# Patient Record
Sex: Female | Born: 1975 | Race: White | Hispanic: No | Marital: Married
Health system: Midwestern US, Academic
[De-identification: ages and names within clinical notes are randomized; demographics above are authoritative.]

---

## 2020-01-02 IMAGING — CR PELVIS
5 series · 5 of 5 positions shown · non-contrast
Comparison: none

[l-spine ap]
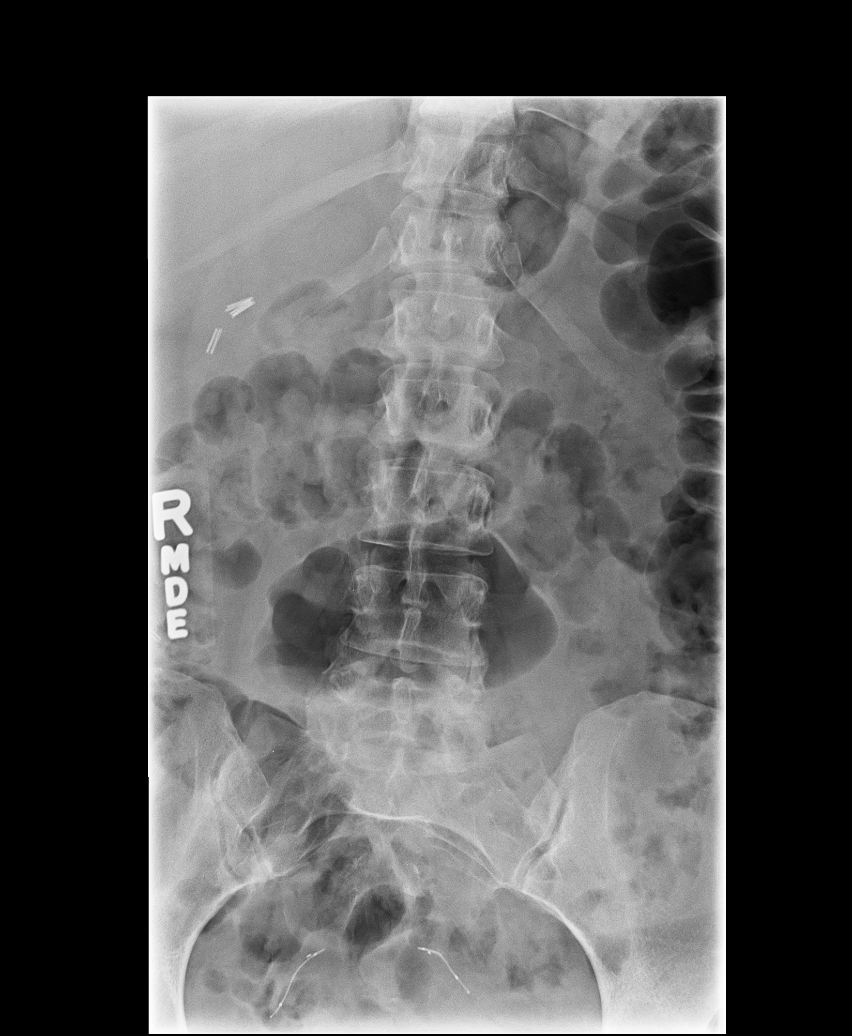

[l-spine obl (1 of 2)]
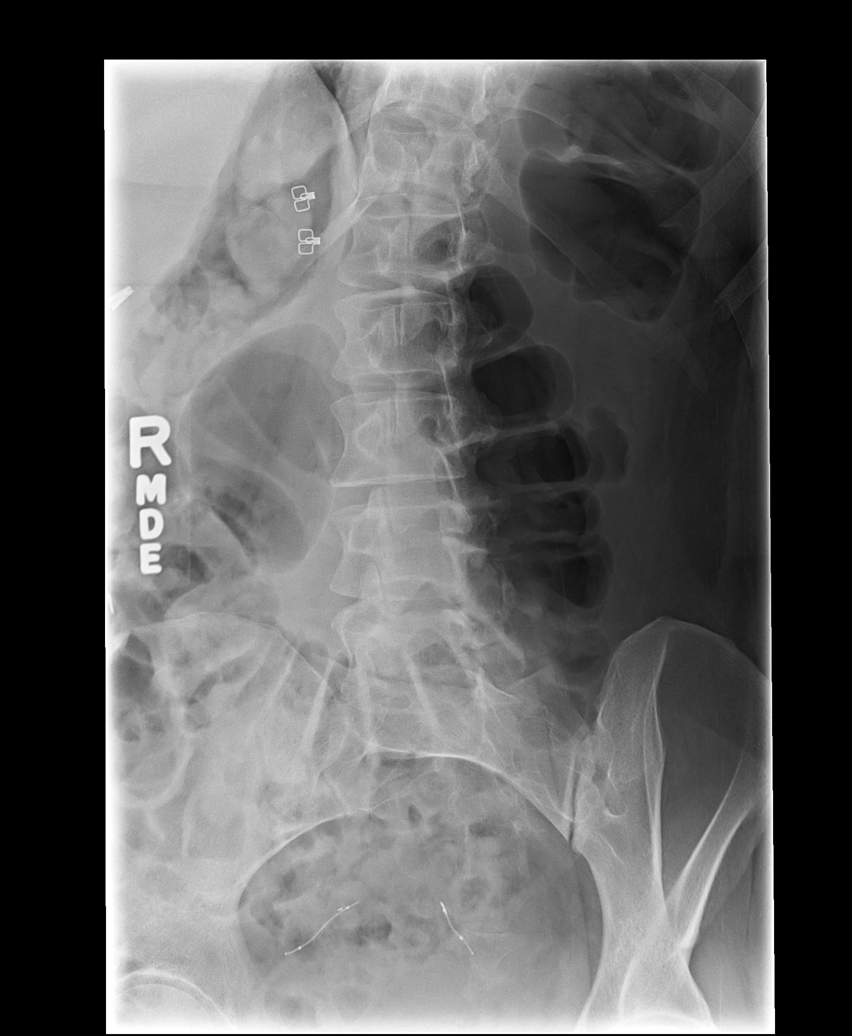

[l-spine obl (2 of 2)]
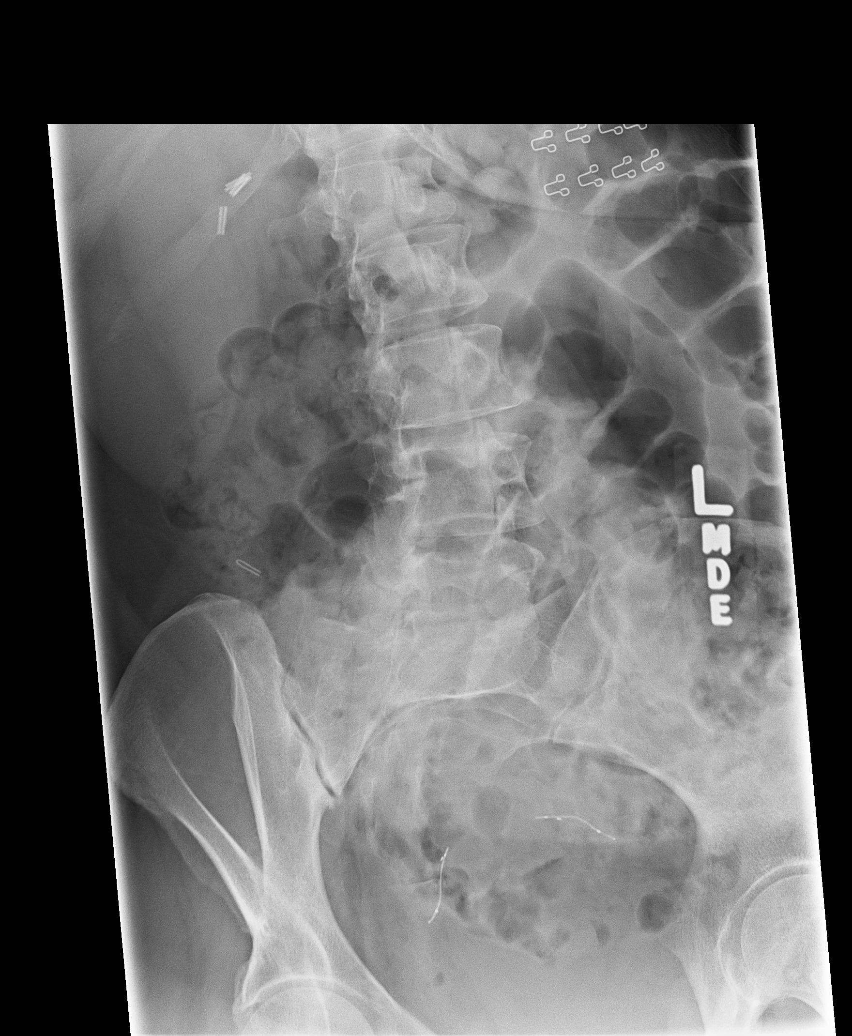

[l-spine lat]
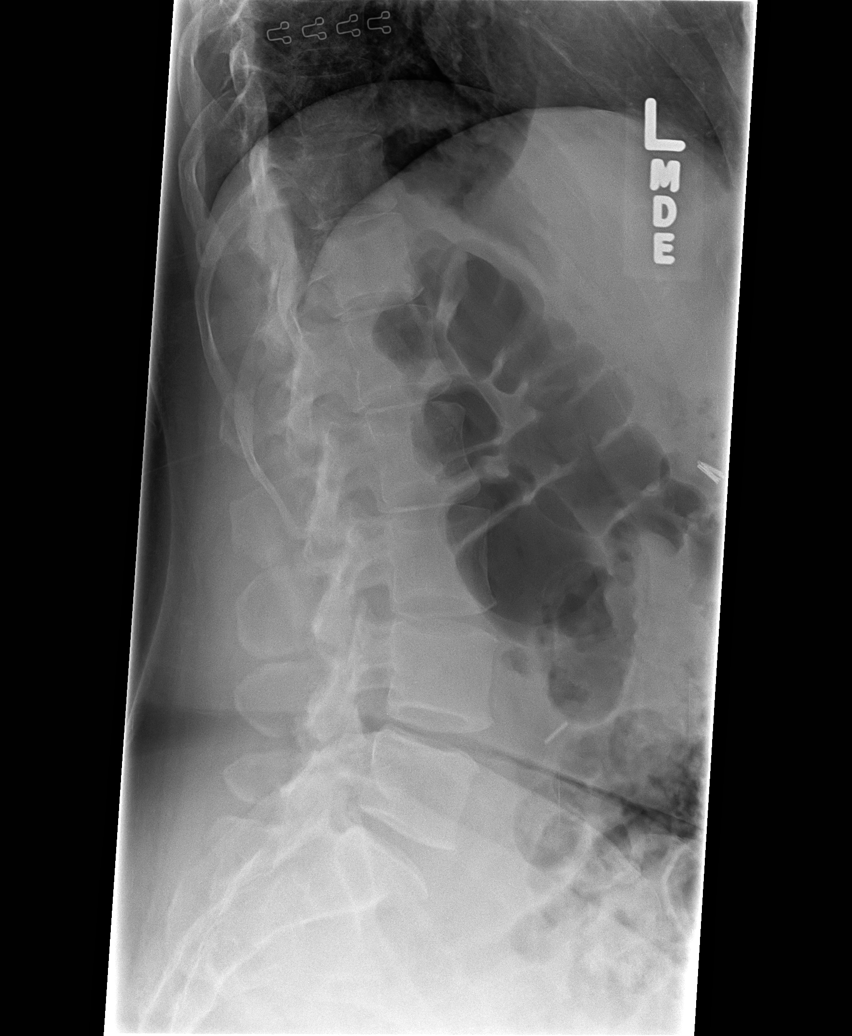

[l-spine l5-s1]
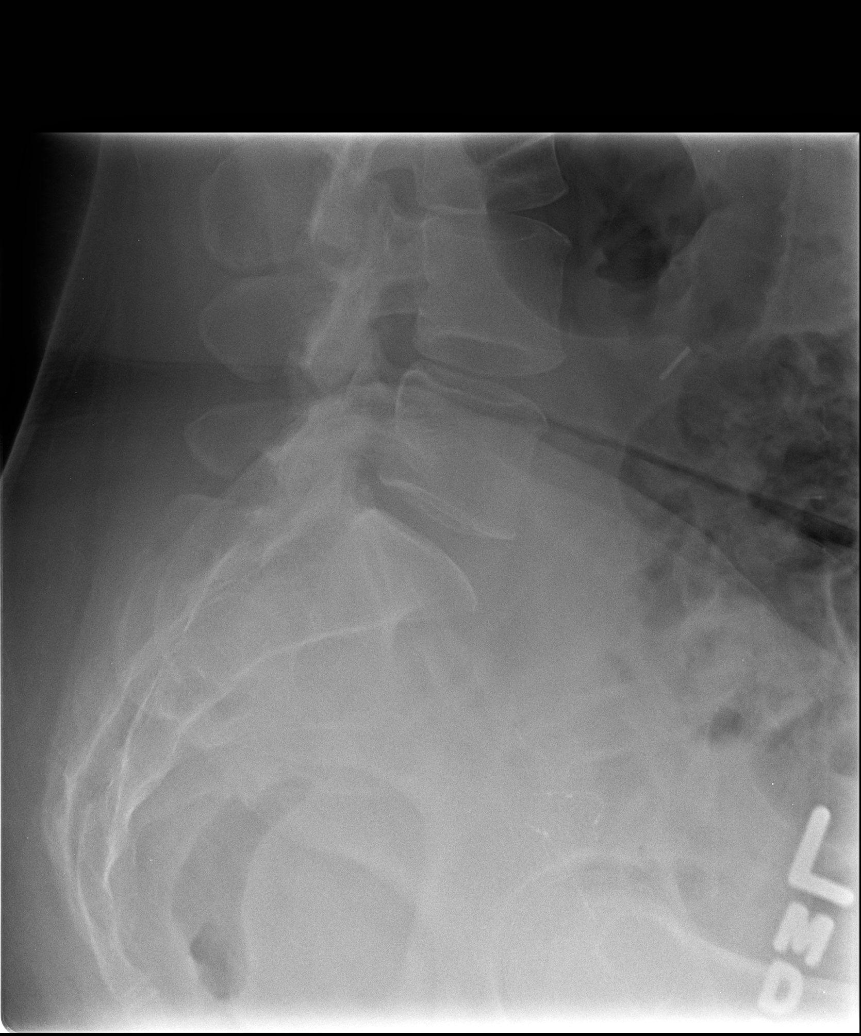

[5 of 5 positions shown; findings below may reference images not displayed]

DIAGNOSTIC STUDIES

EXAM

XR lumbar spine 5 view

INDICATION

pain
Pt c/o chronic lower back pain radiating down legs. ME/CF

TECHNIQUE

AP lateral both oblique and spot films were obtained.

COMPARISONS

None available

FINDINGS

Surgical clips are noted the right upper quadrant. Prior flow been tube embolization is noted. No
compression fractures are seen. Disc spaces are well preserved.

IMPRESSION

Negative lumbar spine. Postop changes as described.

Tech Notes:

Pt c/o chronic lower back pain radiating down legs. ME/CF

## 2020-03-21 IMAGING — CR [ID]
1 series · 1 of 1 positions shown · non-contrast
Comparison: none

[abdomen kub supine]
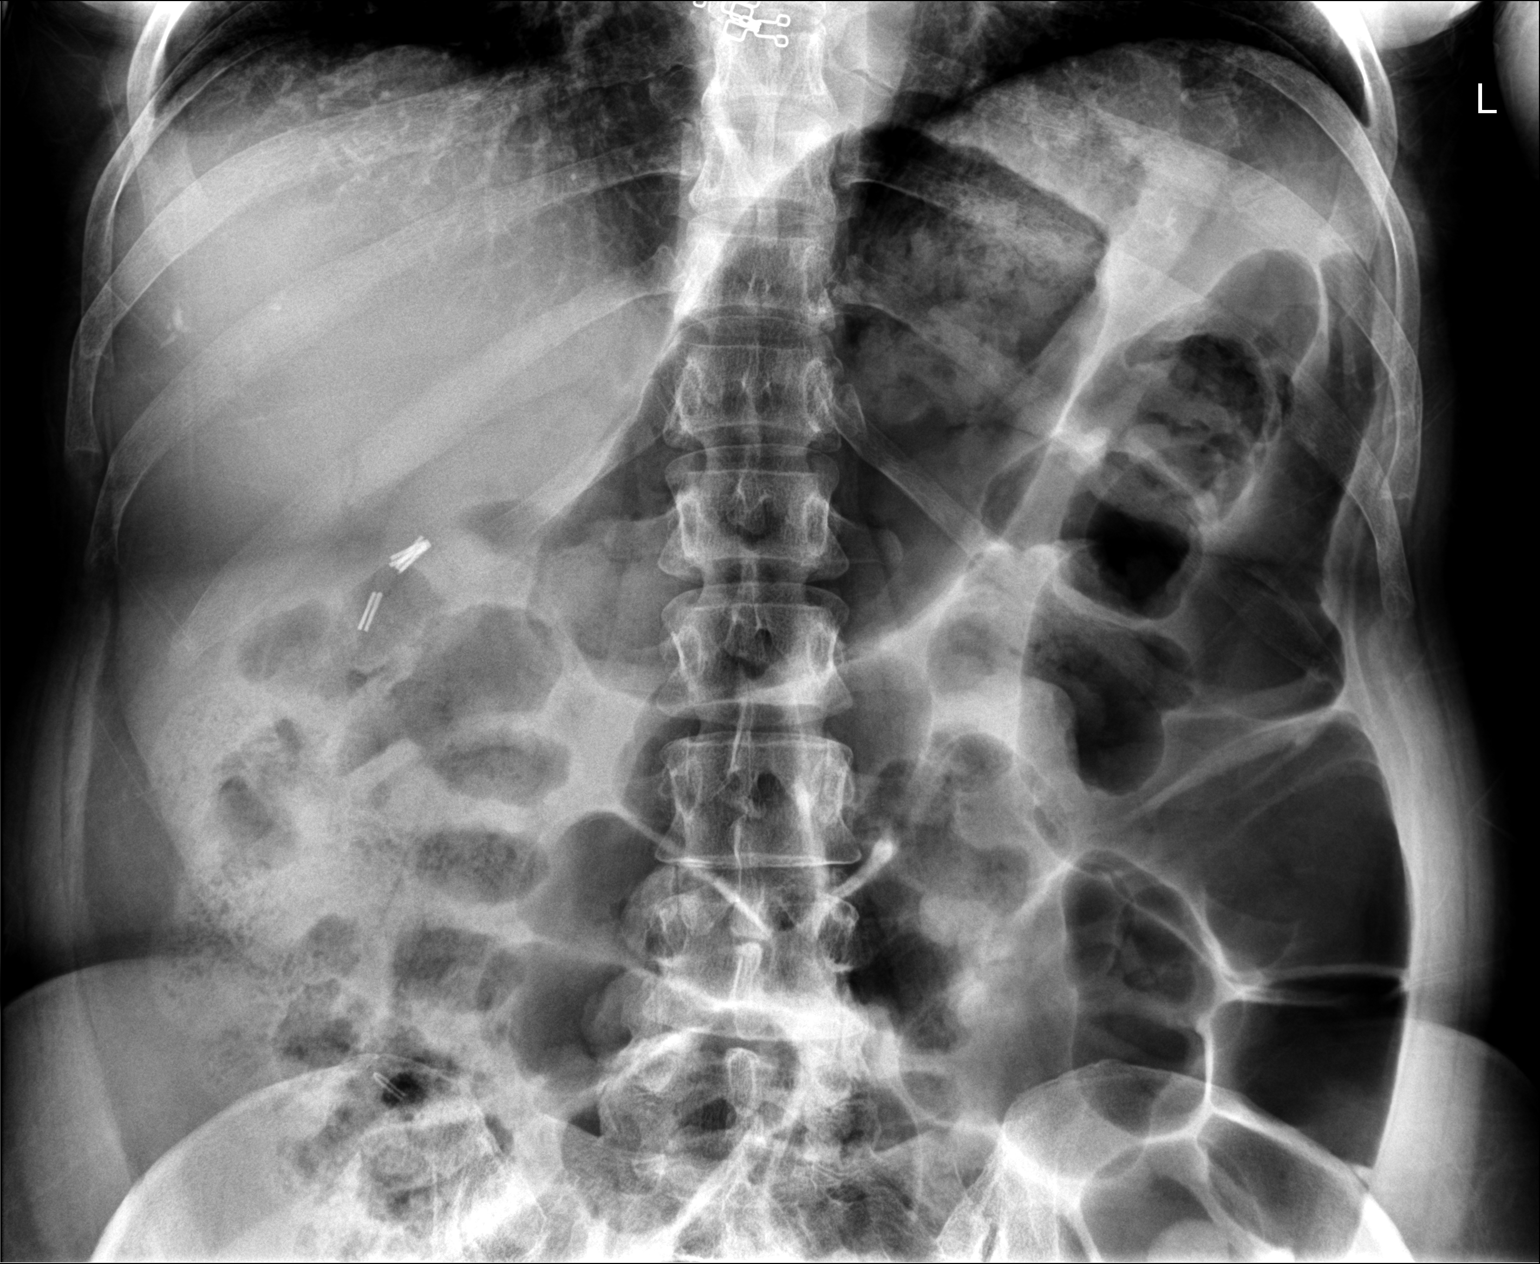

[1 of 1 positions shown; findings below may reference images not displayed]

XR abd 2v, upright and supine Sex:

DIAGNOSTIC STUDIES

EXAM

XR abd 2v, upright and supine

INDICATION

Constipation
Pt states she has not had a bowel movement in 2 weeks. H/O cholecystectomy and c section. ME

TECHNIQUE

COMPARISONS

None

FINDINGS

No free airNonobstructive bowel gas pattern. Gaseous distention of the distal large bowel. Moderate
colonic stool
no pathologic calcifications
Surgical clips in the right upper quadrant. Fallopian tube closure devices

Visualized lung bases are clear

Visualized osseous structures unremarkable

IMPRESSION

Nonobstructive bowel gas pattern

Tech Notes:

Pt states she has not had a bowel movement in 2 weeks. H/O cholecystectomy and c section. ME

## 2020-05-02 IMAGING — CR [ID]
1 series · 1 of 1 positions shown · non-contrast
Comparison: none

[chest ap]
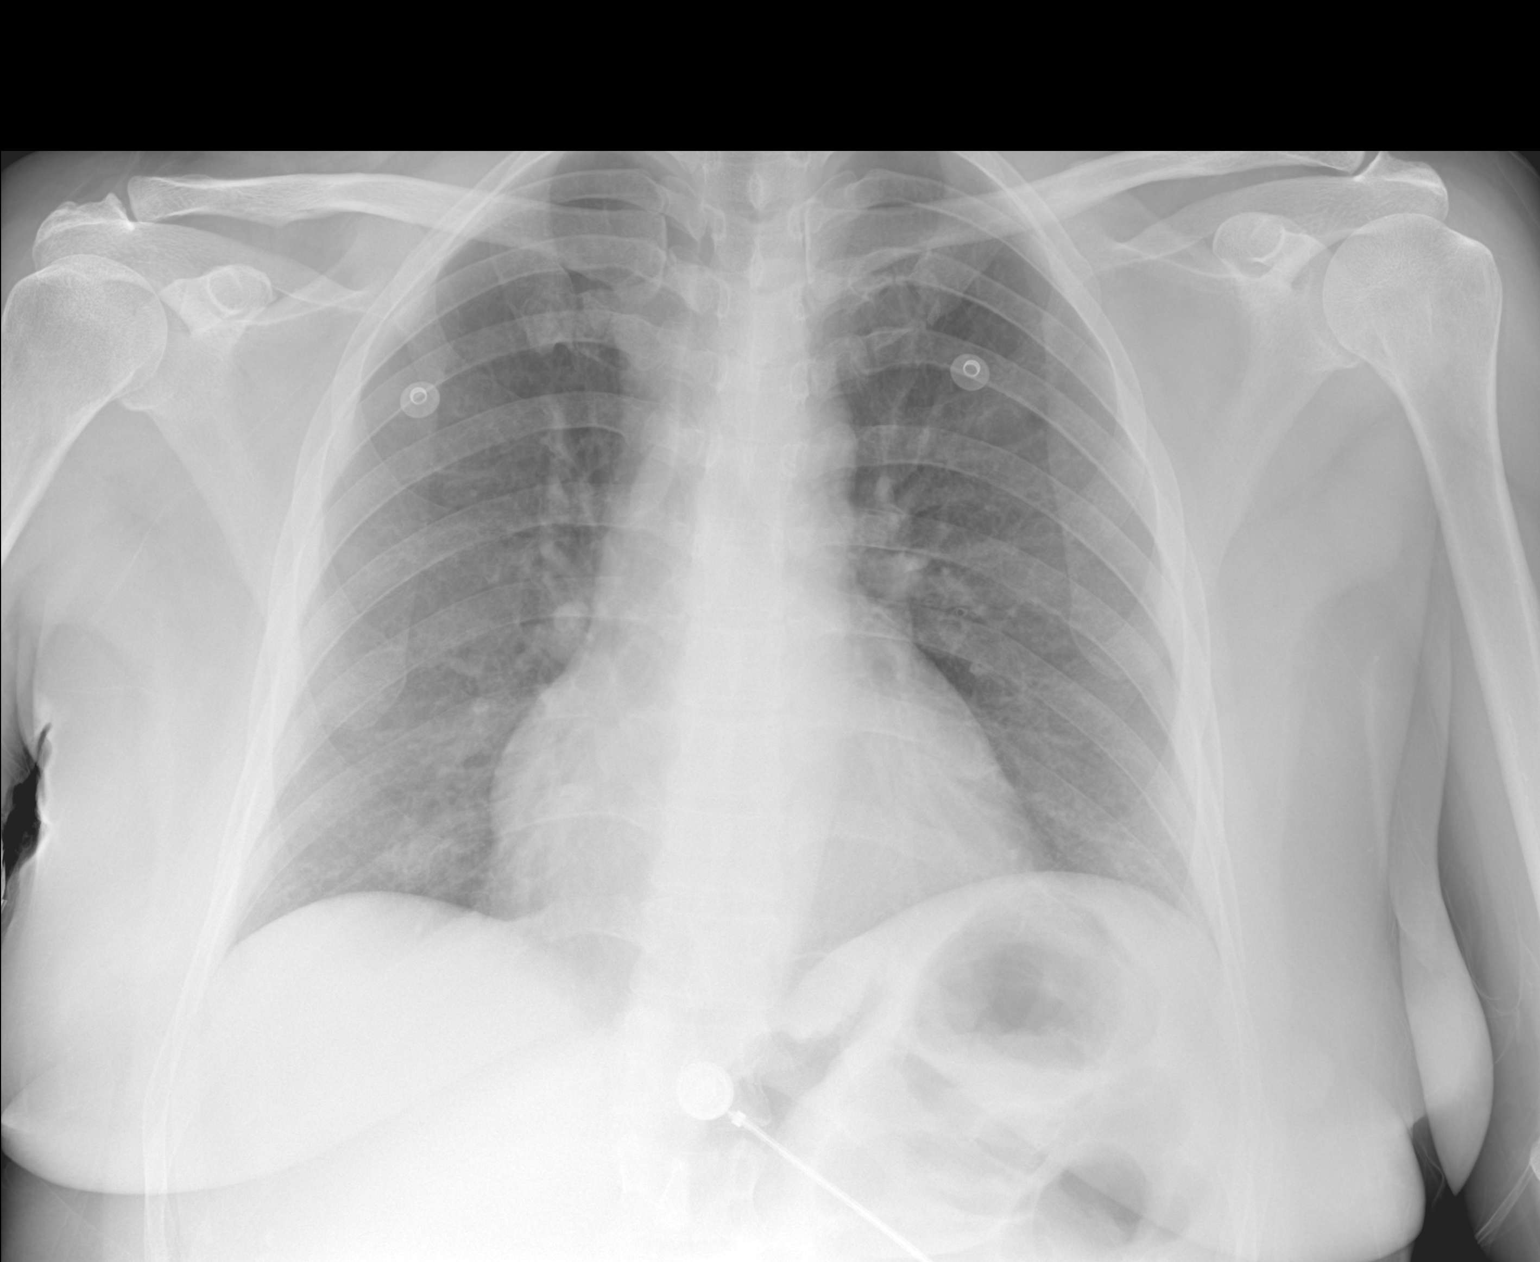

[1 of 1 positions shown; findings below may reference images not displayed]

EXAM

XR chest 1V

INDICATION

Chest pain

TECHNIQUE

Single view of the chest

COMPARISONS

None available at the time of dictation.

FINDINGS

No radiographically apparent pleural effusion, consolidation, or pneumothorax.

The cardiomediastinal silhouette is  normal in size.

The osseous structures are without an acute osseous abnormality.

IMPRESSION
1. No radiographic evidence of an acute cardiopulmonary process.

Tech Notes:

PT C/O CHEST PAIN.  AB

## 2020-05-02 IMAGING — CT BRAIN WO(Adult)
3 of 4 series · 14 of 47 positions shown, 16 images · non-contrast
Comparison: none

[Series 4: brain cor 5.00 hr40 s3 · coronal · 0.32mm/px · 3 of 42 slices shown]
[im 14/42  brain]
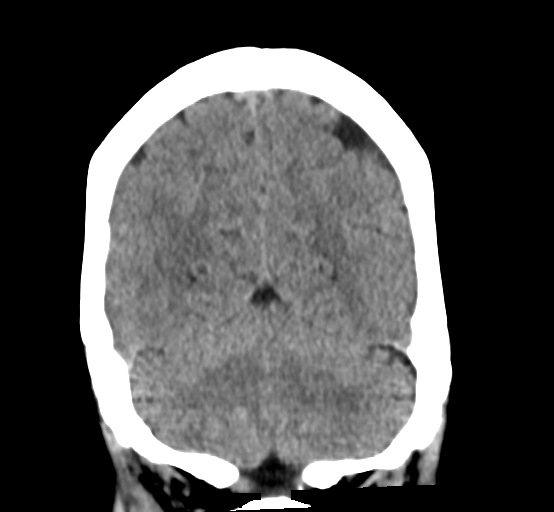
[im 19/42  brain]
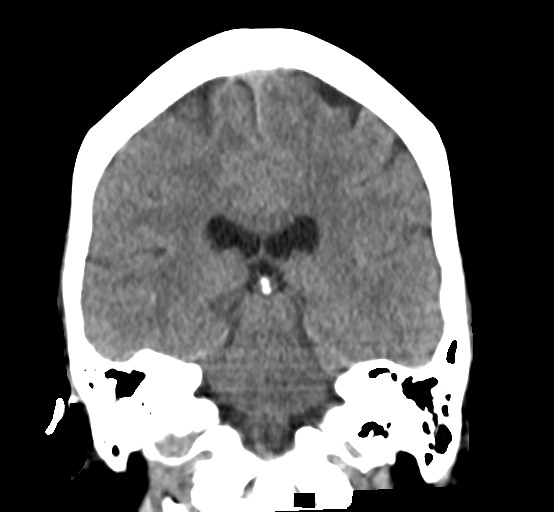
[im 23/42  brain]
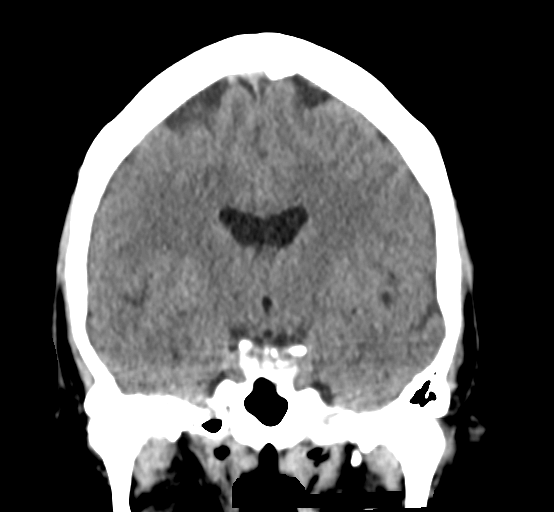

[Series 6: brain sag 5.00 hr40 s3 · sagittal · 0.32mm/px · 3 of 35 slices shown]
[im 12/35  brain]
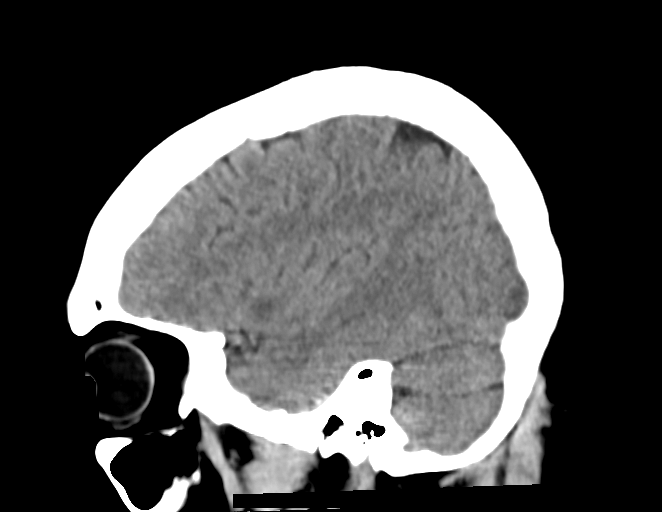
[im 18/35  brain]
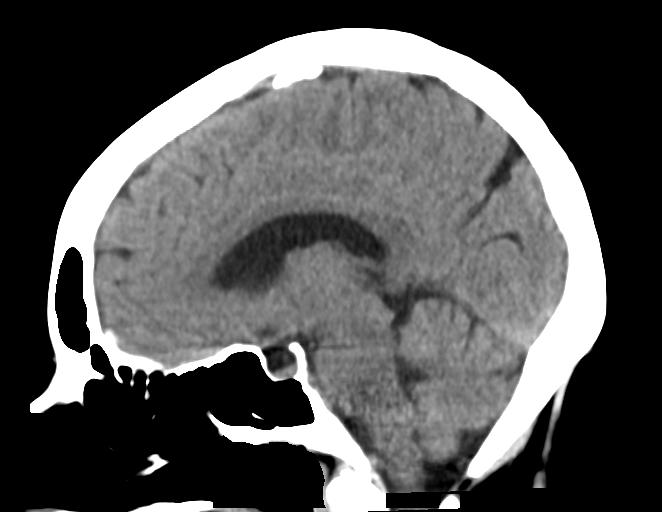
[im 23/35  brain]
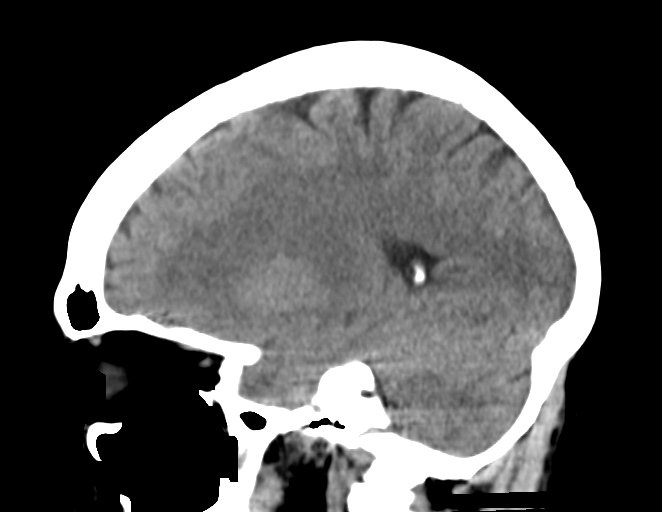

[Series 8: brain ax 2.00 hr60 s3 · axial · 0.34mm/px · z∈[-479,-349]mm · 8 of 81 slices shown, 10 images]
[im 8/81  brain]
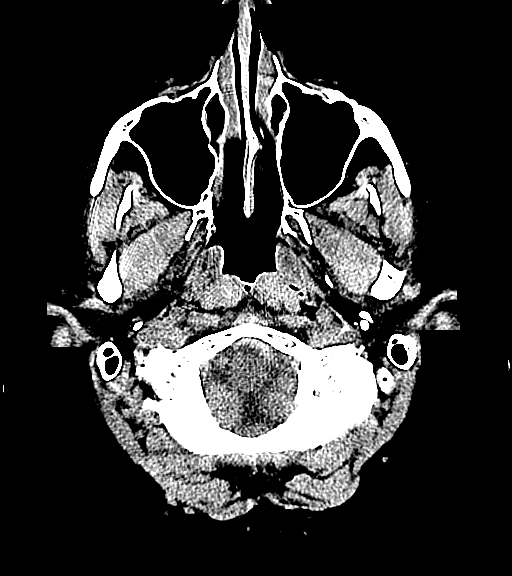
[im 8/81  bone]
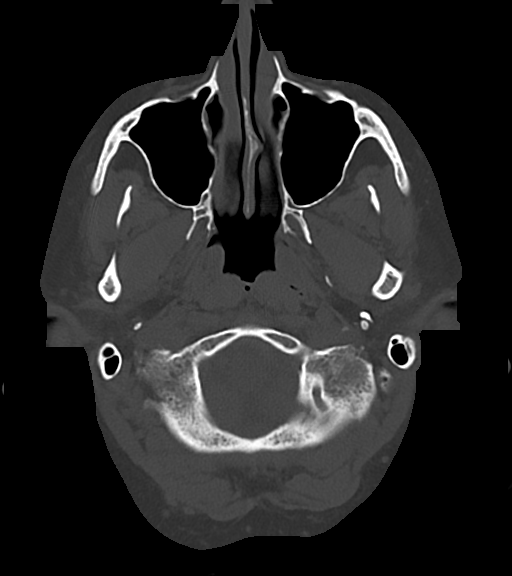
[im 16/81  brain]
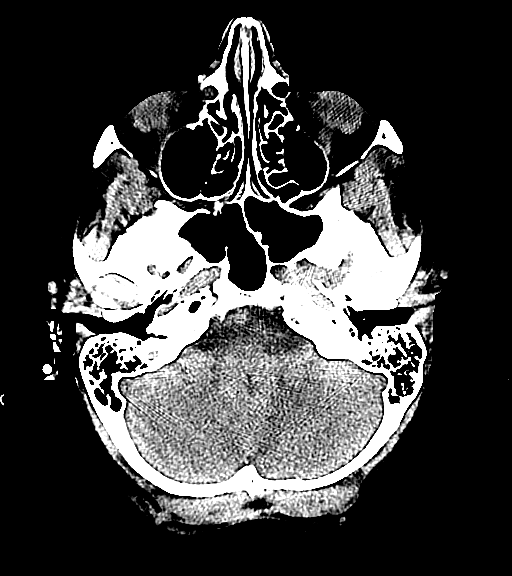
[im 27/81  brain]
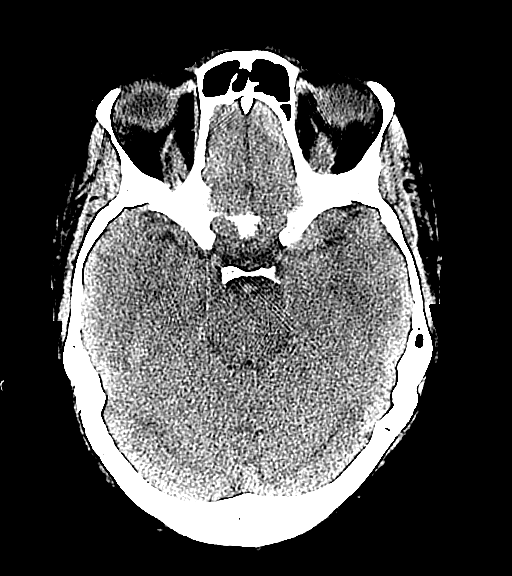
[im 35/81  brain]
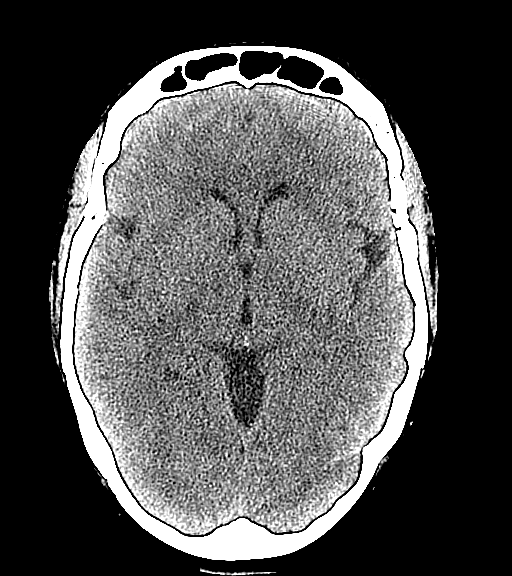
[im 46/81  brain]
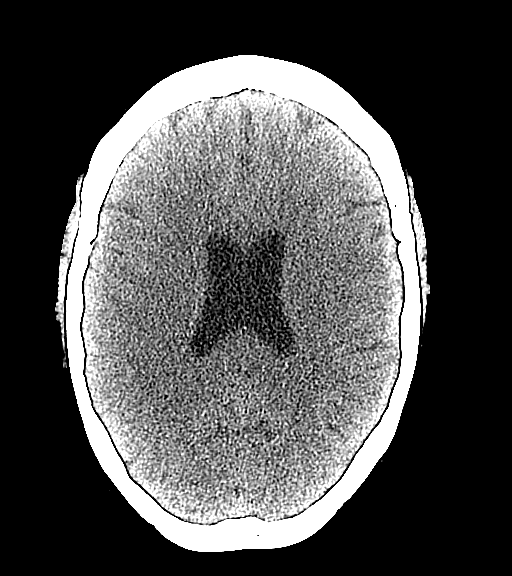
[im 46/81  bone]
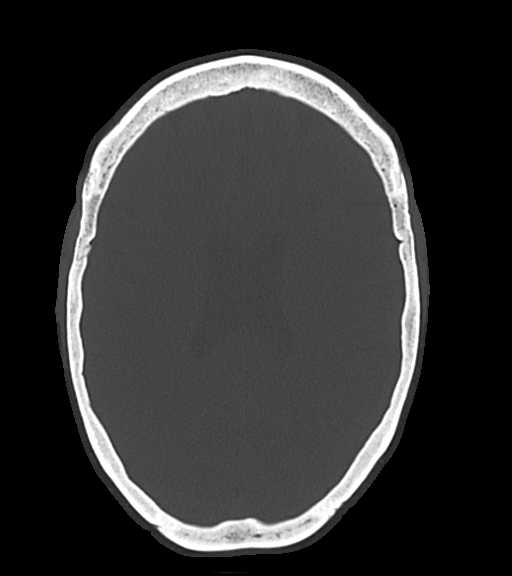
[im 54/81  brain]
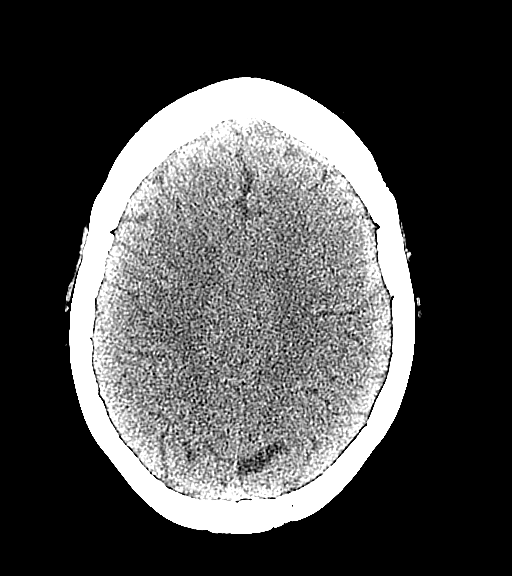
[im 65/81  brain]
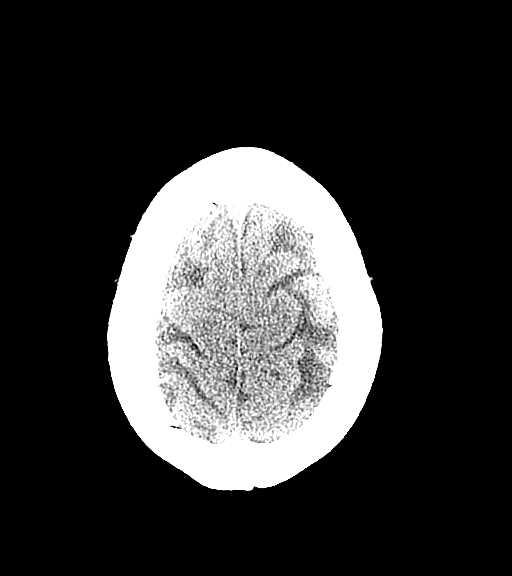
[im 73/81  brain]
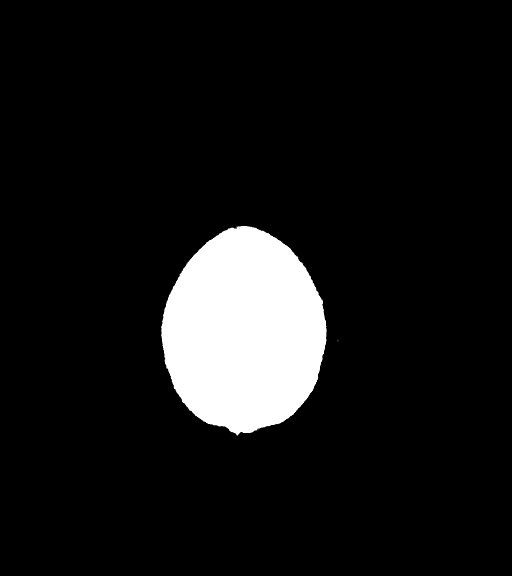

[14 of 47 positions shown; findings below may reference images not displayed]

EXAM

Head CT.

INDICATION

left arm numb
PT STATES HAS HEADACHE AND LEFT ARM NUMBNESS. CT/NM 2/0. TJ

TECHNIQUE

Noncontrast CT scan of the head with coronal and sagittal reformations. 2 prior CT scans at no
myocardial profusion scans in the past year. All CT scans at this facility use dose modulation,
iterative reconstruction, and/or weight based dosing when appropriate to reduce radiation dose to as
low as reasonably achievable.

COMPARISONS

December 09, 2019 head CT

FINDINGS

There is no intra-axial or extra-axial hemorrhage. The lateral ventricles and other CSF spaces are
symmetric in size and age appropriate. No midline shift. No mass effect. The paranasal sinuses and
mastoid air cells are clear. The orbits are symmetric. No postseptal or retrobulbar edema or
hemorrhage. The midline anatomy is unremarkable. Gray-white differentiation is preserved.

IMPRESSION

No evidence of acute intracranial pathology.

Tech Notes:

PT STATES HAS HEADACHE AND LEFT ARM NUMBNESS. CT/NM 2/0. TJ

## 2020-05-06 IMAGING — CT PE(Adult)
2 of 4 series · 15 of 36 positions shown · non-contrast
Comparison: none

[Series 7: cta pulmonary cor 2.00 bv36 s3 · coronal · 0.62mm/px · 3 of 141 slices shown]
[im 29/141  mediastinal]
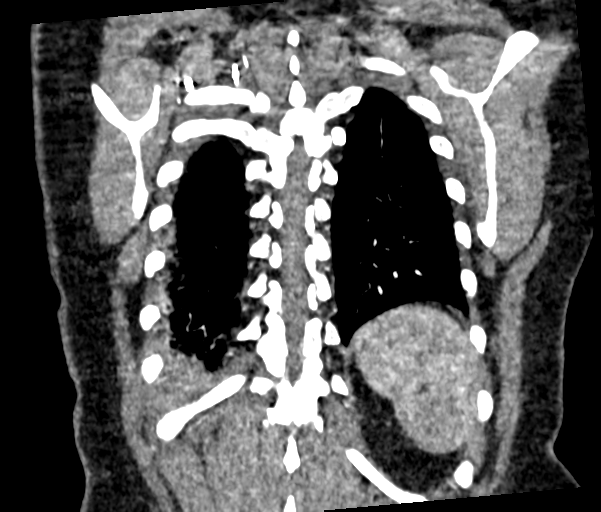
[im 57/141  mediastinal]
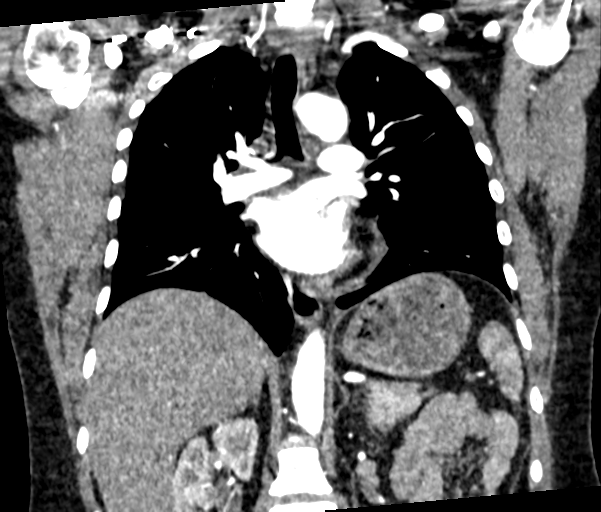
[im 85/141  mediastinal]
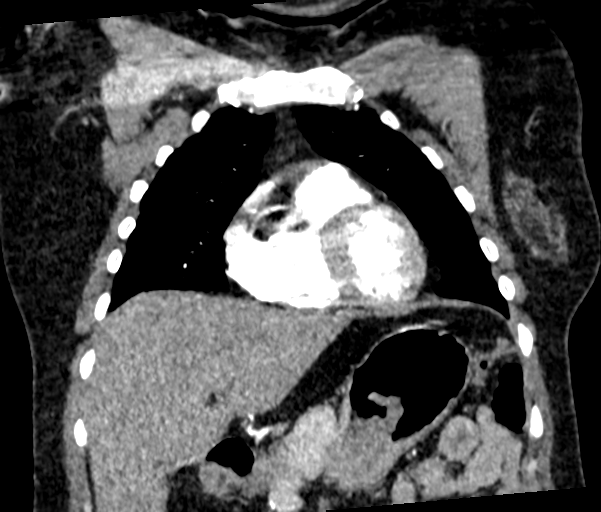

[Series 11: cta pulmonary ax 1.50 br60 s3 · axial · 0.55mm/px · z∈[+1508,+1778]mm · 12 of 209 slices shown]
[im 14/209  lung]
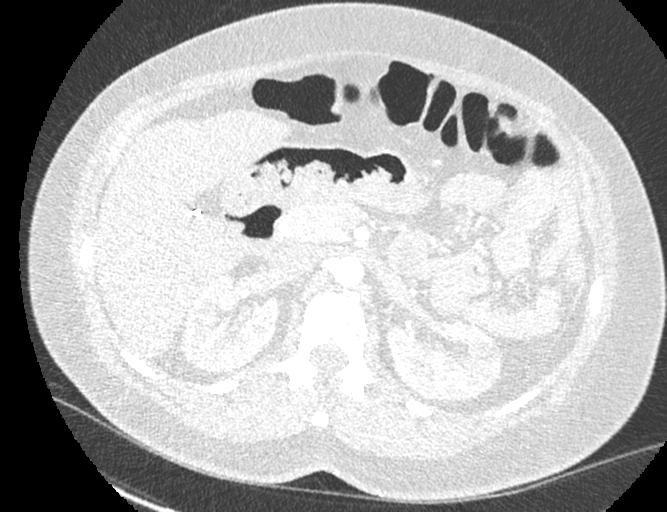
[im 28/209  mediastinal]
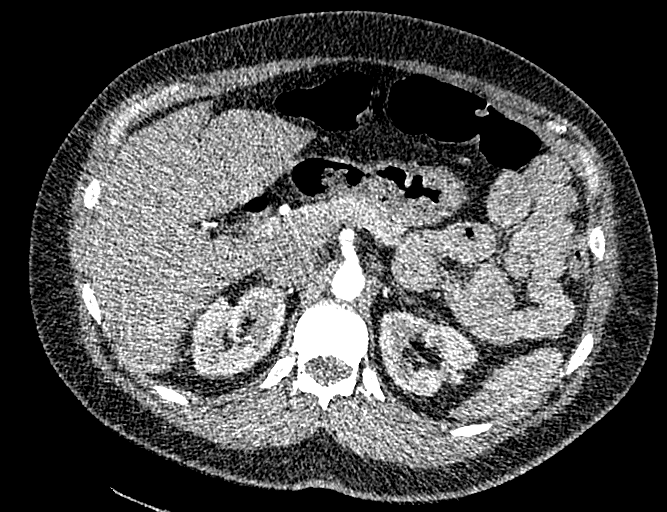
[im 42/209  lung]
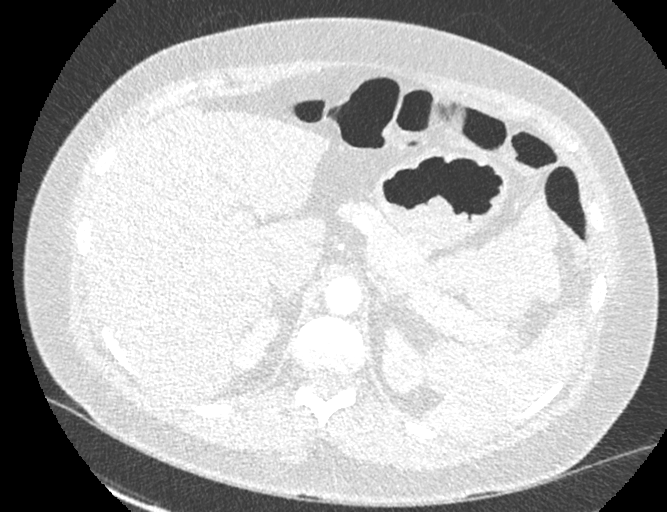
[im 70/209  mediastinal]
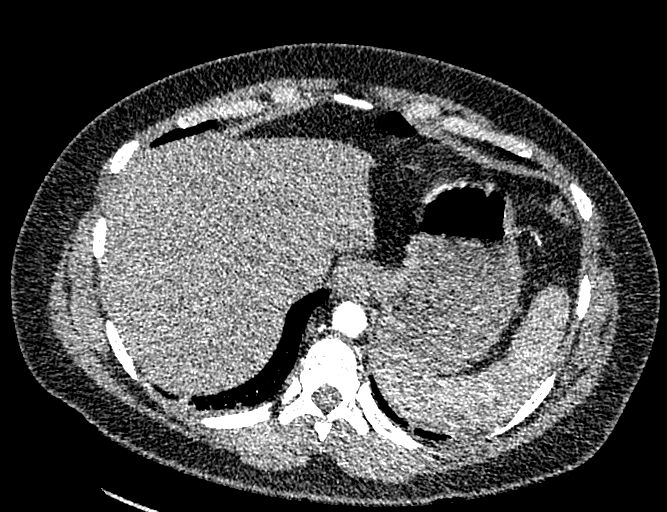
[im 84/209  lung]
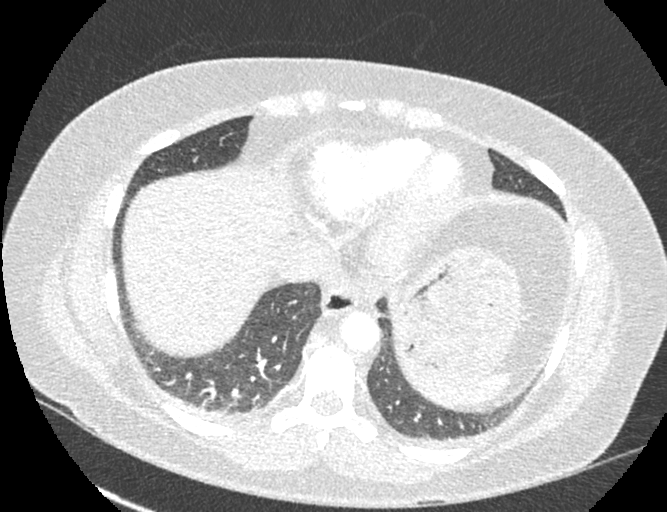
[im 98/209  mediastinal]
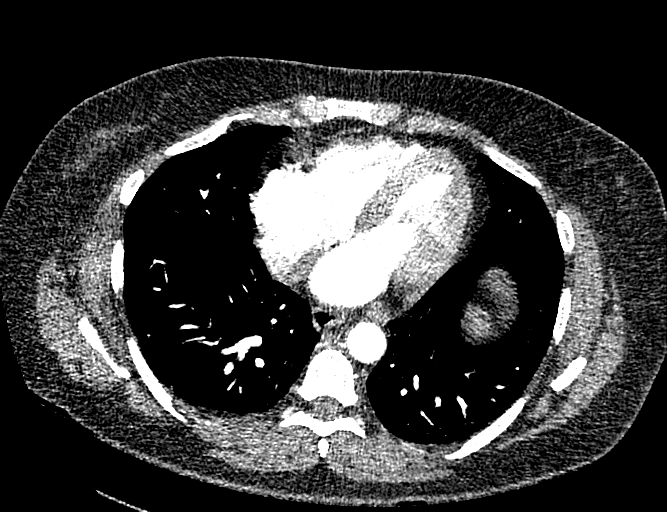
[im 111/209  lung]
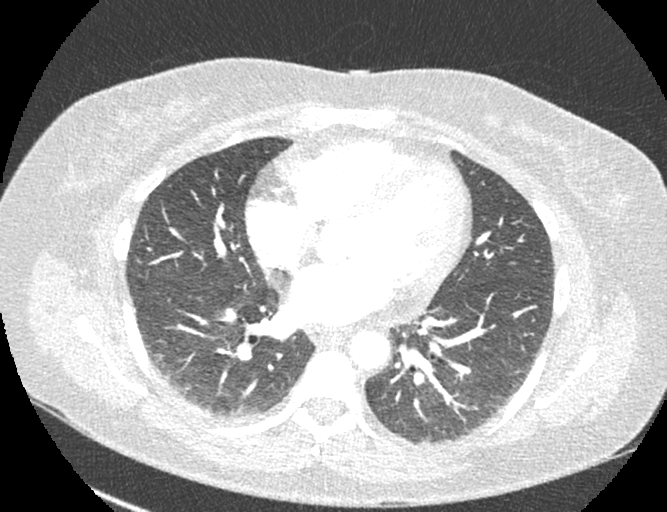
[im 125/209  mediastinal]
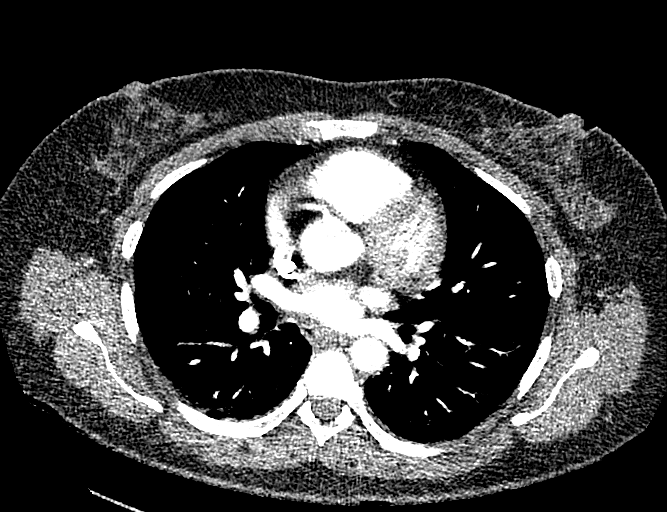
[im 139/209  lung]
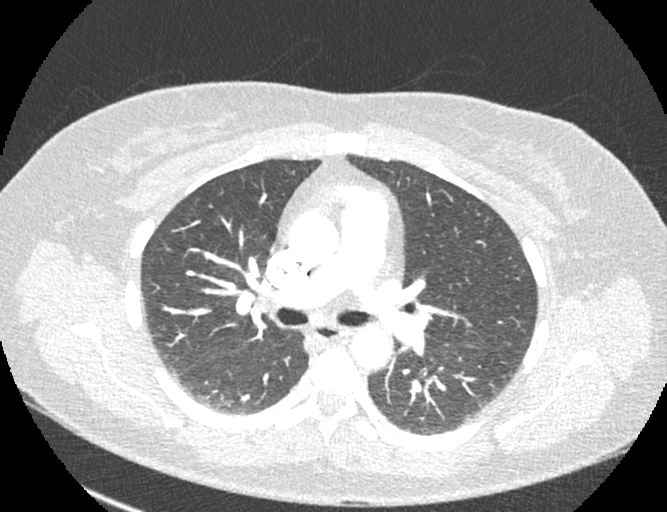
[im 167/209  mediastinal]
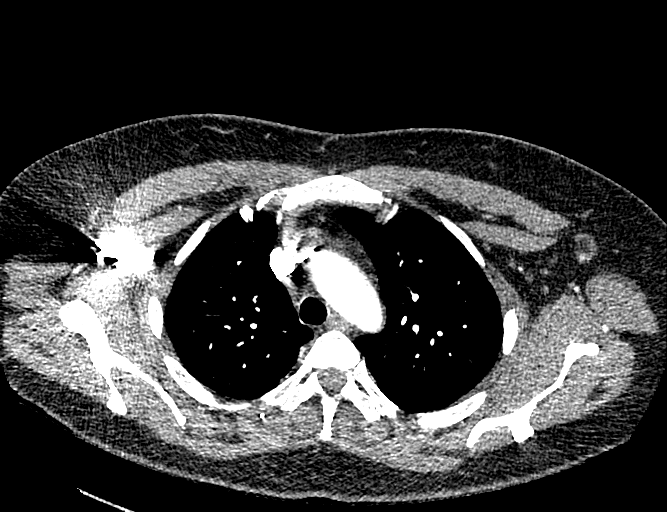
[im 181/209  lung]
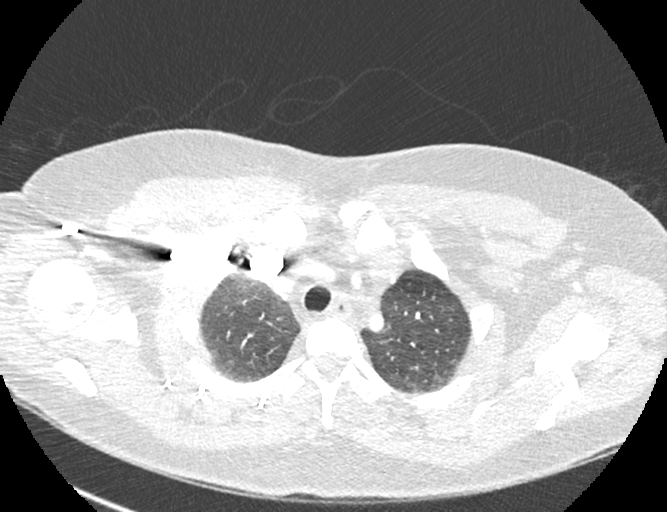
[im 195/209  mediastinal]
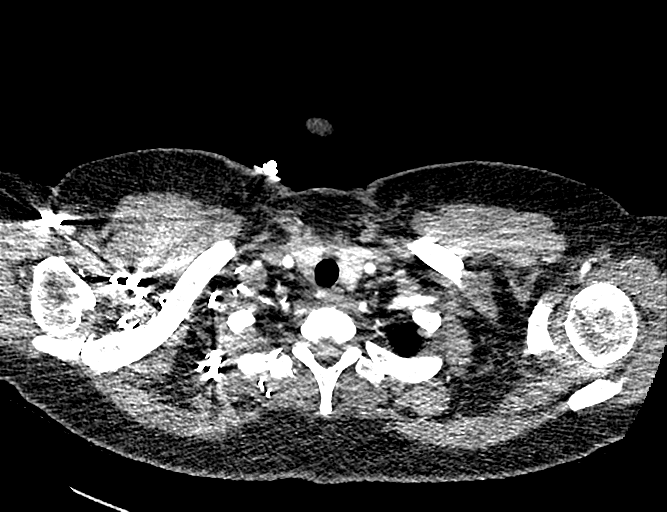

[15 of 36 positions shown; findings below may reference images not displayed]

DIAGNOSTIC STUDIES

EXAM

COMPUTED TOMOGRAPHY, THORAX, WITH CONTRAST MATERIAL CPT 03555

INDICATION

chest pain elevated d dimer
Patient c/o chest pain and elevated d dimer. NmniSQ5 100mL used via IV. CT/NM 2/0. Hx of tubal.
BM/CF .

TECHNIQUE

Contiguous axial tomographic images were obtained from the lung apices to the upper abdomen with
intravenous contrast.   Coronal and sagittal reformatted images were obtained.

All CT scans at this facility use dose modulation, iterative reconstruction, and/or weight based
dosing when appropriate to reduce radiation dose to as low as reasonably achievable.

2 CT and 0 nuclear scans in the last year.

COMPARISONS

No priors available for comparison.

FINDINGS

The heart appears normal in size. There is no right heart strain. No pericardial effusion. Motion
artifact involving the ascending thoracic aorta. Otherwise, no definite aortic aneurysm or
dissection. No definite acute pulmonary embolism. No pathologically enlarged lymph nodes. No
significant pleural effusion. No gross pneumothorax. There is no dense consolidation. At least mild
hepatic steatosis. Cholecystectomy. The adrenal glands appear grossly unremarkable. Cyst within the
lower pole of the right kidney. Cortical scarring involving the left kidney. No acute fracture.

IMPRESSION

1. No definite acute pulmonary embolism.

Tech Notes:

Patient c/o chest pain and elevated d dimer. NmniSQ5 100mL used via IV. CT/NM 2/0. Hx of tubal.
BM/CF

## 2020-05-06 IMAGING — CR [ID]
1 series · 1 of 1 positions shown · non-contrast
Comparison: none

[chest ap]
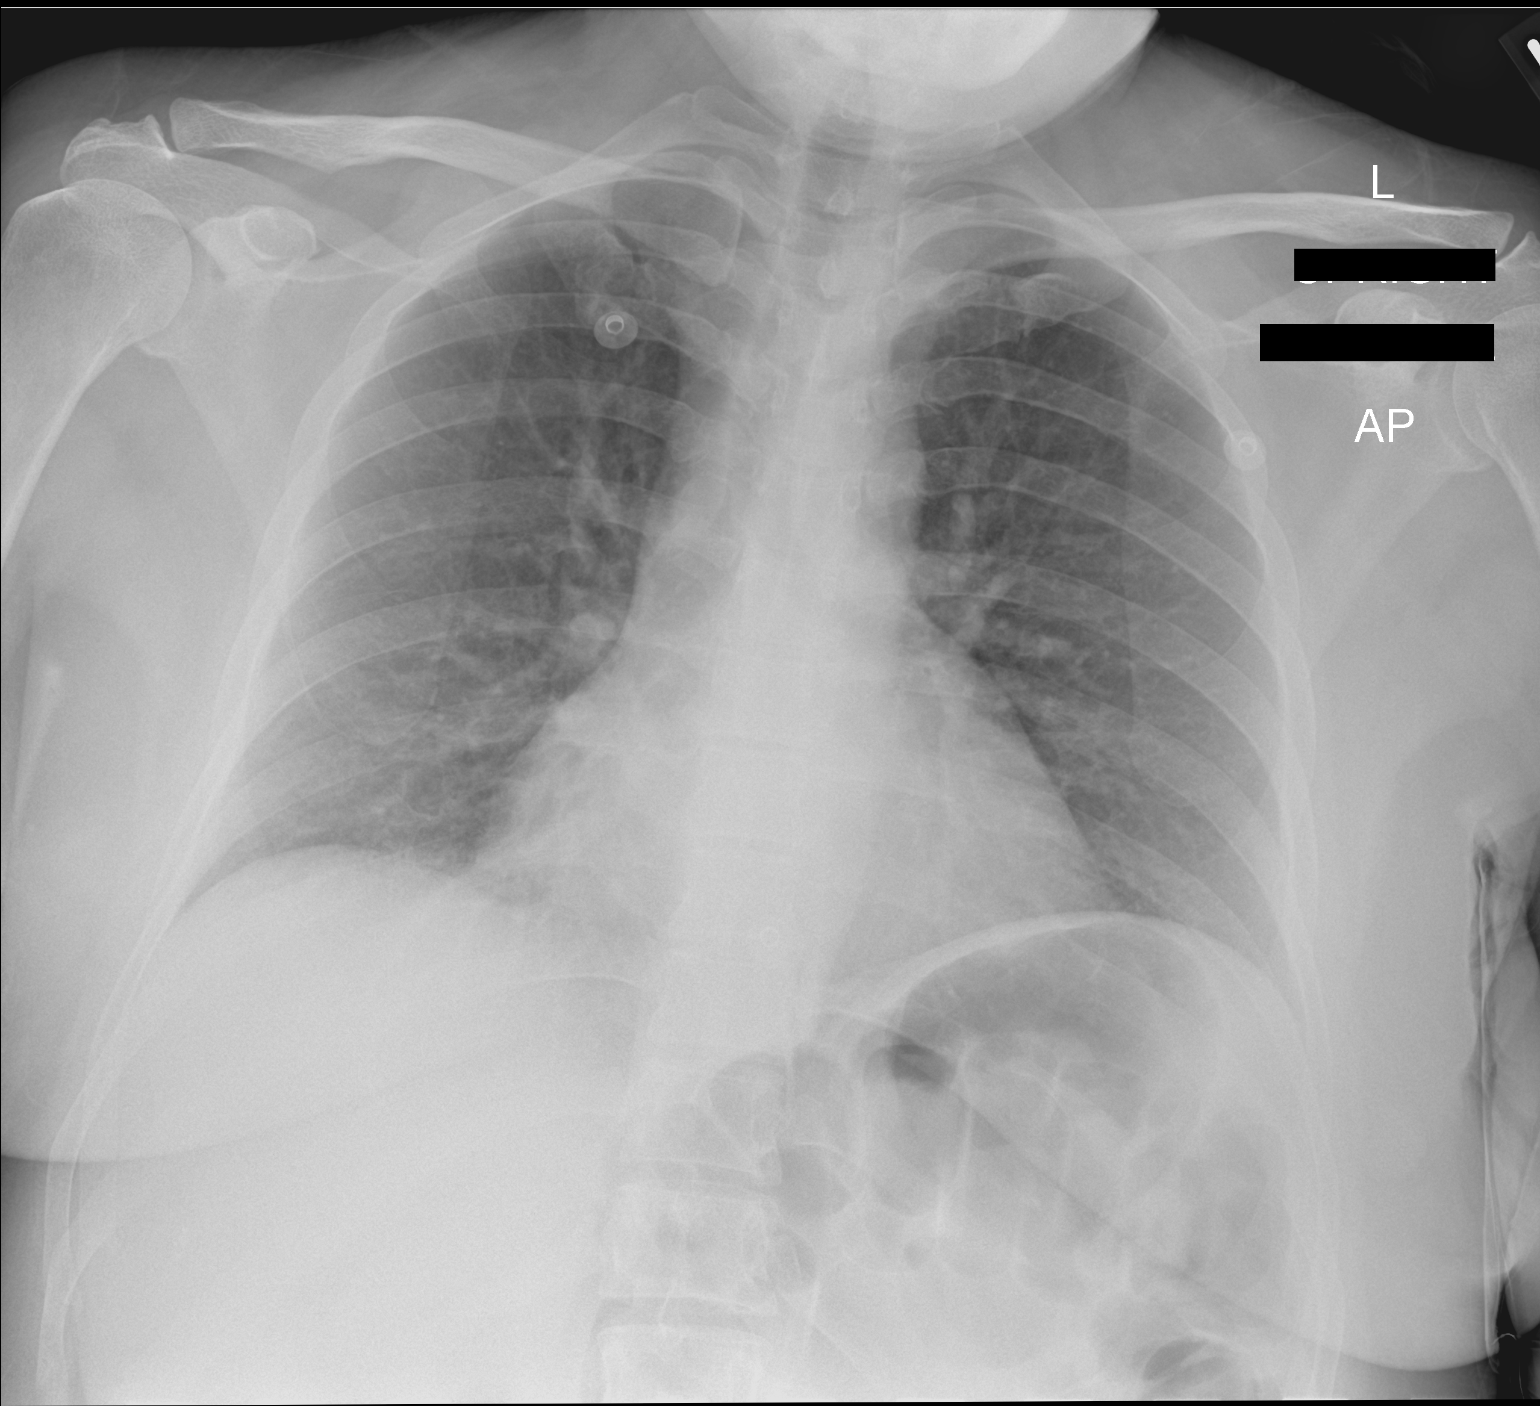

[1 of 1 positions shown; findings below may reference images not displayed]

EXAM

RADIOLOGICAL EXAMINATION, CHEST; SINGLE VIEW, FRONTAL CPT 89797

INDICATION

Patient complains of chest pain. MS/CF/BM

TECHNIQUE

1 view of the chest was acquired.

COMPARISONS

Previous examination dated 05/02/2020.

FINDINGS

The cardiac silhouette is within normal limits. The lungs are free of focal consolidations.
Pulmonary vasculature is normal in caliber. The costophrenic angles are sharp. The bony structures
are adequately mineralized.

IMPRESSION

No acute cardiopulmonary process. No significant interval change when compared with recent
examination dated 05/02/2020.

Tech Notes:

Pt c/o chest pain. MS/CF/BM

## 2020-05-09 IMAGING — CR [ID]
1 series · 1 of 1 positions shown · non-contrast
Comparison: none

[chest ap]
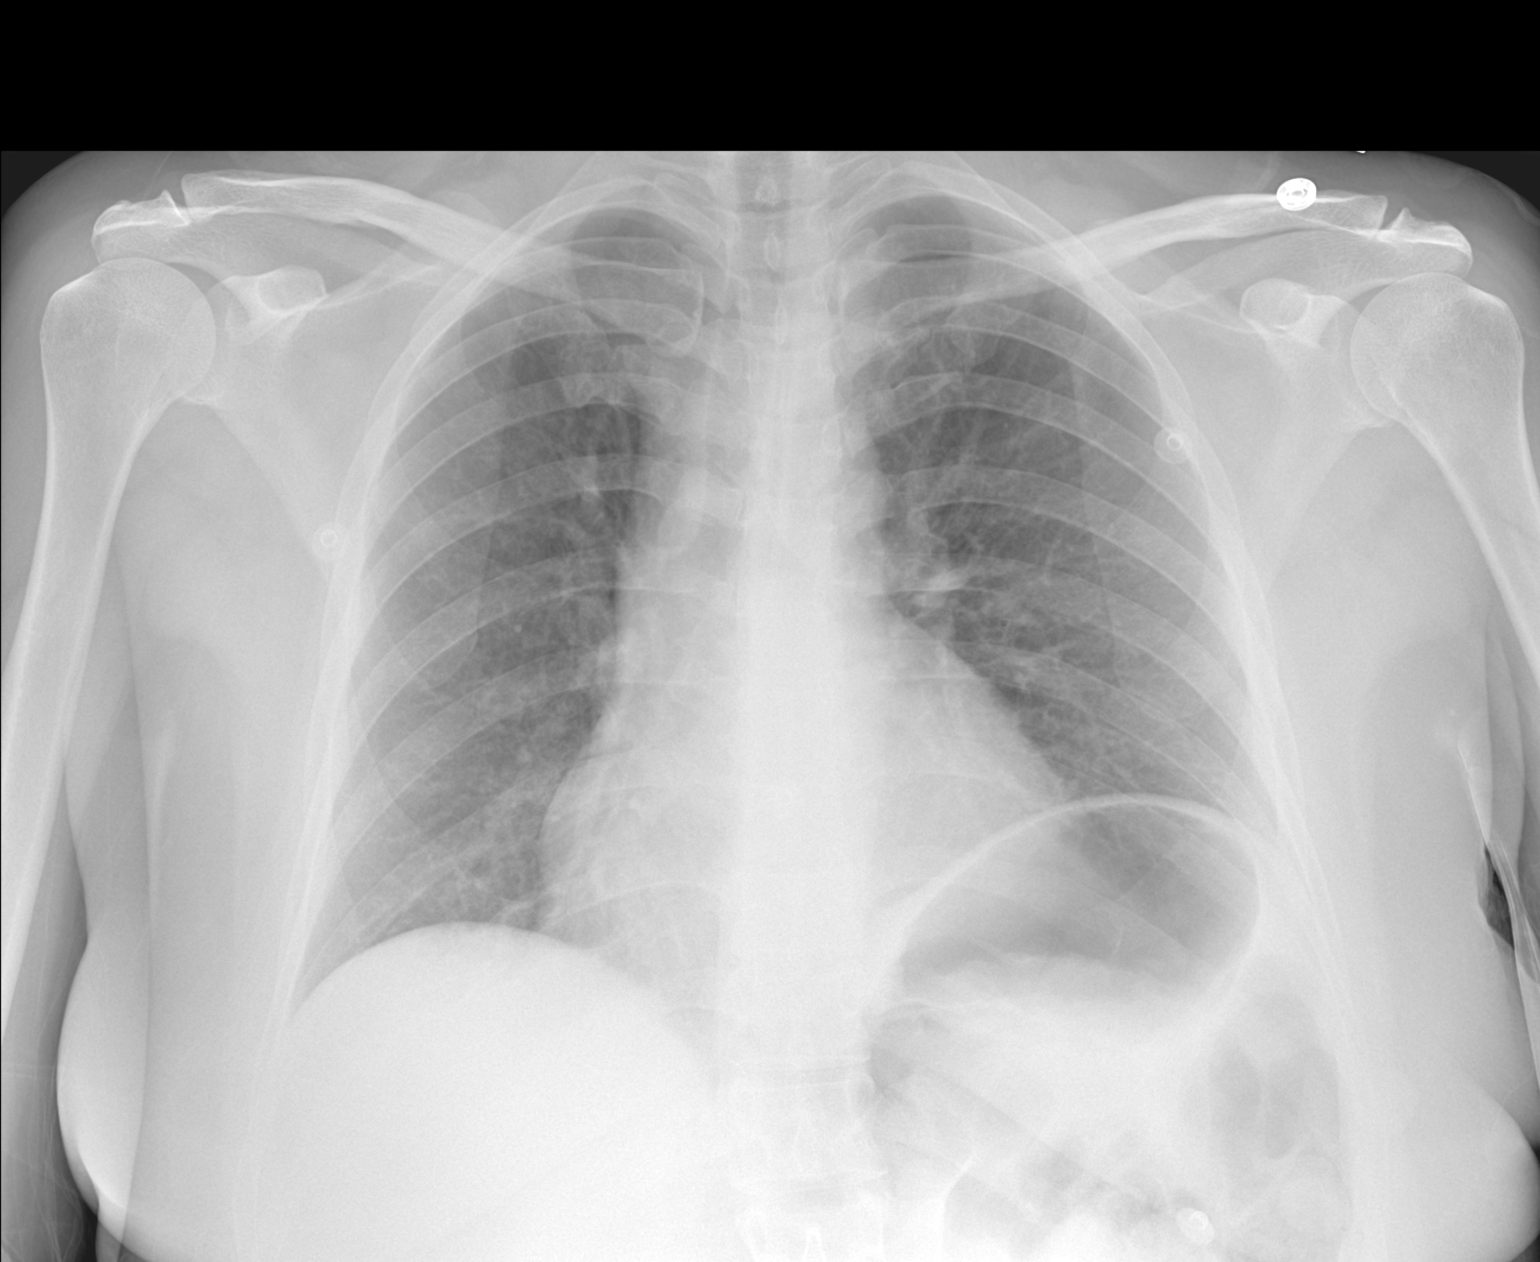

[1 of 1 positions shown; findings below may reference images not displayed]

DIAGNOSTIC STUDIES

EXAM

XR chest 1V

INDICATION

chest pain
CHEST PAIN.  TM/HB

TECHNIQUE

AP chest.

COMPARISONS

May 06, 2020

FINDINGS

Cardiac silhouette is within normal limits. Lungs are clear. No acute infiltrates are seen. Osseous
structures are stable.

IMPRESSION

No evidence for active disease in the chest.

Tech Notes:

CHEST PAIN.
TM/HB

## 2020-05-14 ENCOUNTER — Encounter: Admit: 2020-05-14 | Discharge: 2020-05-14 | Payer: MEDICAID | Primary: Family

## 2020-05-16 ENCOUNTER — Encounter: Admit: 2020-05-16 | Discharge: 2020-05-16 | Payer: MEDICAID | Primary: Family

## 2020-05-16 ENCOUNTER — Ambulatory Visit: Admit: 2020-05-16 | Discharge: 2020-05-16 | Payer: MEDICAID | Primary: Family

## 2020-05-16 DIAGNOSIS — R079 Chest pain, unspecified: Secondary | ICD-10-CM

## 2020-05-16 IMAGING — NM NM stress 1[PERSON_NAME]<250 M<275
7 series · 60 of 60 positions shown · non-contrast
Comparison: none

05/16/20 - Procedure: ADAC MULTI GATED SESTAMIBI REGADENOSON MPI STRESS TEST
Duration and Sequencing:  Same-Day Rest/Stress

Interpretation Summary
Nuclear Report
Division of Nuclear Cardiac Imaging
Consultation Report
EXAMINATION:  Gated Hechnetium-SSm Sestamibi Same-Day Rest/Stress myocardial perfusion single-photon
emission computed tomography resting regional wall function, post stress ejection fraction, and
perfusion imaging utilizing Regadenoson pharmacological stress.
Study #:  [id number]
[facility] MRN:  [id number]
[HOSPITAL] ID:
BMI: 30.9 kg/m2
INDICATIONS FOR STUDY (HISTORY):  This is a 44 year old female with a history of chest pain.
PROCEDURAL DETAILS:  This Gated Same-Day Rest/Stress perfusion study was performed using Technetium-
99m Sestamibi as the perfusion agent.  Initially, at rest the patient received an intravenous
injection 8.4 mCi of Hechnetium-SSm Sestamibi. Planar and gated tomographic images were acquired.
Subsequently, the patient received a 5 ml intravenous infusion of Regadenoson at 0.08 mg/ml over 10
to 15 seconds.  Approximately 20 seconds later 26.4 mCi of Hechnetium-SSm Sestamibi was injected
intravenously.  Continuous electrocardiographic monitoring and serial electrocardiograms were
obtained, as well as intermittent blood pressure recordings. Planar and gated tomographic images
were subsequently acquired.  Rest images were compared to post stress images.

[(person_name) · 4.52mm/px · 1 of 1 slices shown]
[im 1/1]
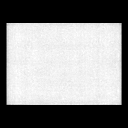

[rstmac statics · 2.26mm/px · 1 of 2 frames shown]
[frame 2/2]
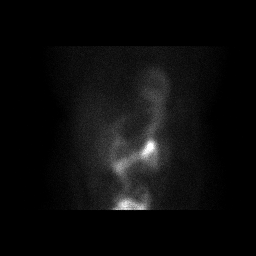

[rest · 6.78mm/px · 3 of 72 frames shown]
[frame 19/72]
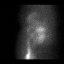
[frame 43/72]
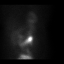
[frame 67/72]
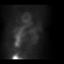

[strmac static · 2.26mm/px · 1 of 2 frames shown]
[frame 2/2]
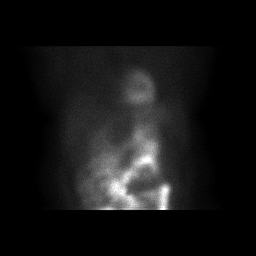

[sgate · 6.78mm/px · 3 of 576 frames shown]
[frame 145/576]
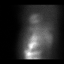
[frame 337/576]
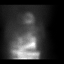
[frame 529/576]
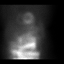

[myometrix results · 6.8mm · 6.78mm/px · 7 acquisitions, 13 frames shown]
[im 1/7]
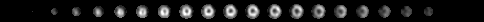
[im 1/7]
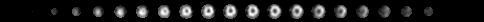
[im 2/7]
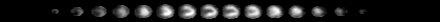
[im 2/7]
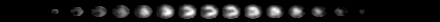
[im 2/7]
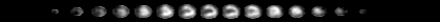
[im 3/7]
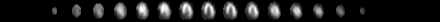
[im 3/7]
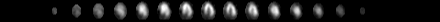
[im 3/7]
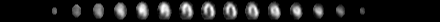
[im 4/7]
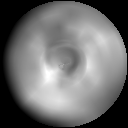
[im 6/7]
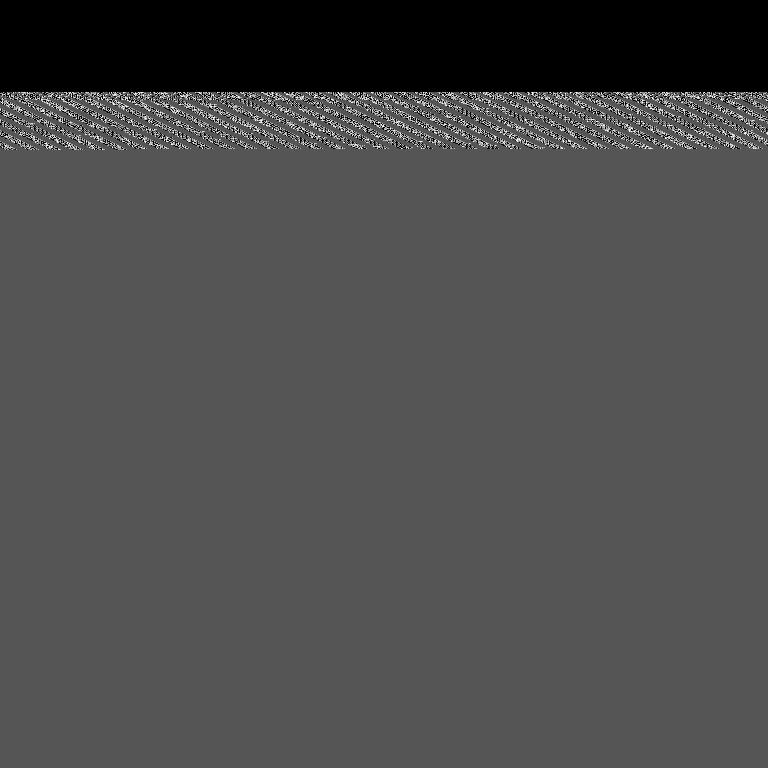
[im 6/7]
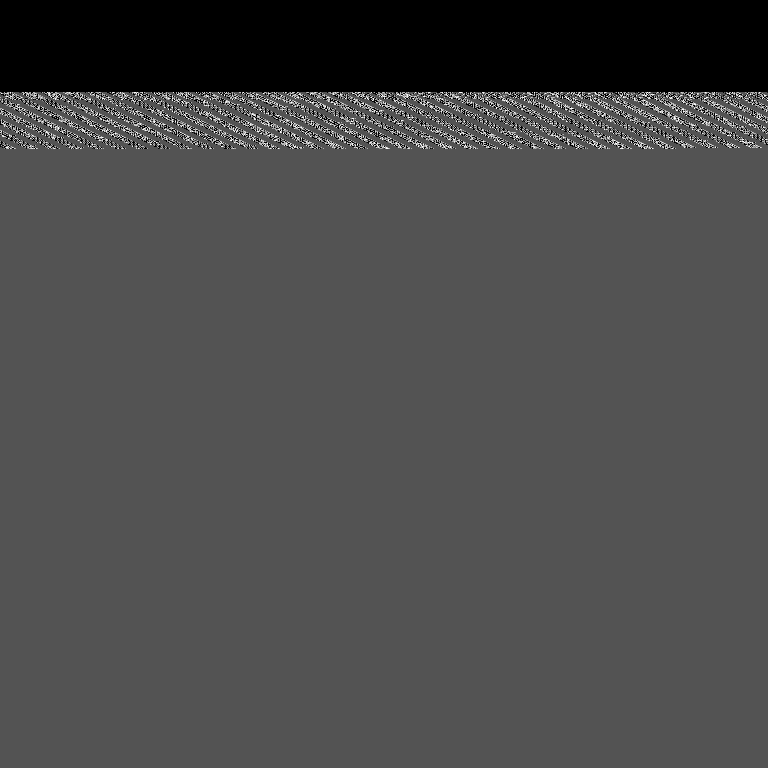
[im 6/7]
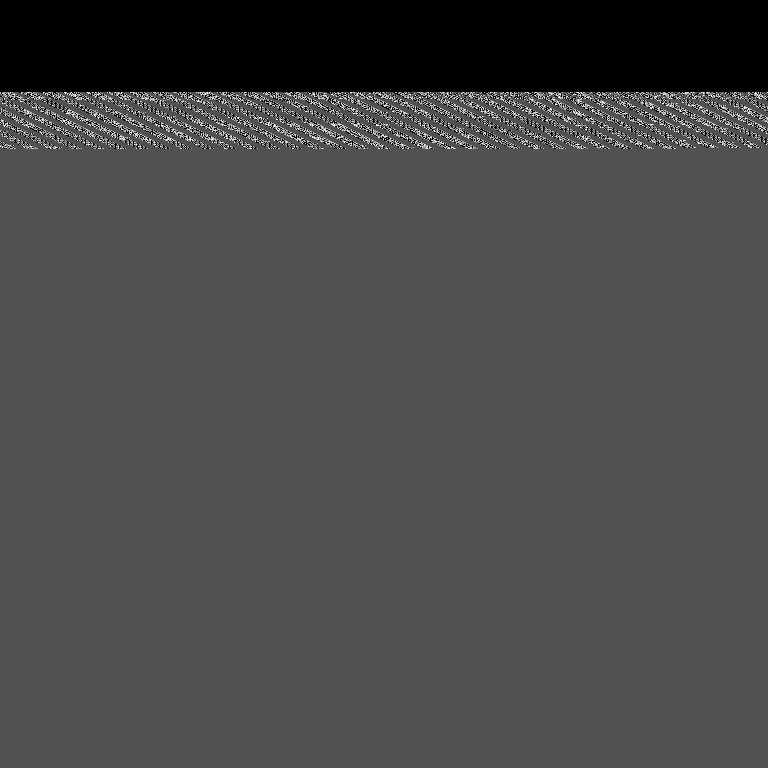
[im 7/7]
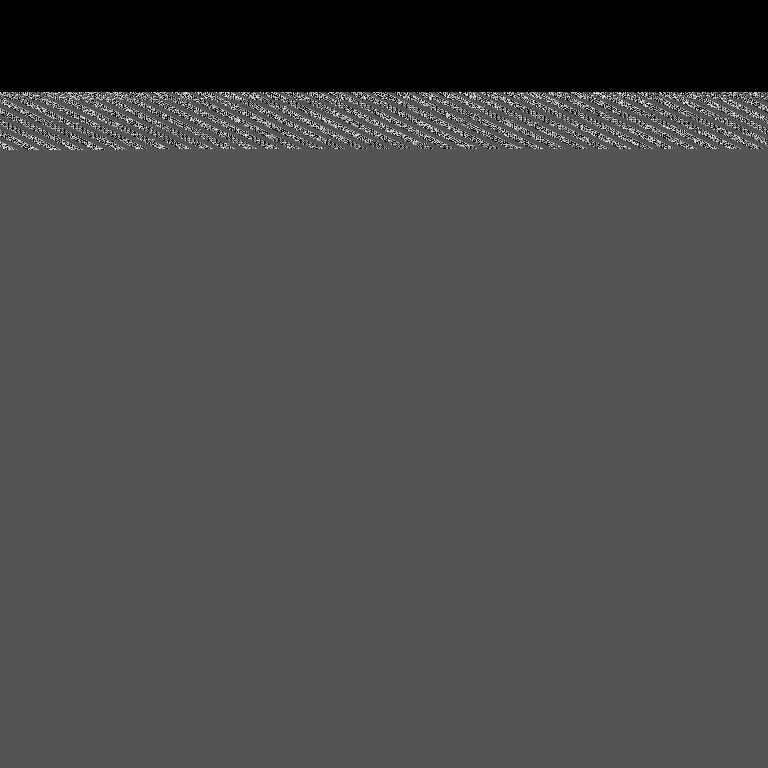

[cardiac spect · 6.78mm/px · 14 acquisitions, 38 frames shown]
[im 1/14]
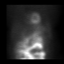
[im 1/14]
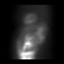
[im 1/14]
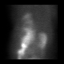
[im 2/14]
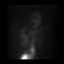
[im 2/14]
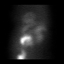
[im 2/14]
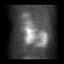
[im 3/14]
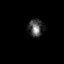
[im 3/14]
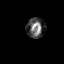
[im 4/14]
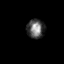
[im 4/14]
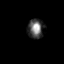
[im 4/14]
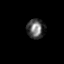
[im 5/14]
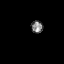
[im 5/14]
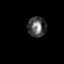
[im 5/14]
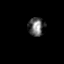
[im 6/14]
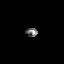
[im 6/14]
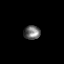
[im 6/14]
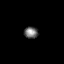
[im 7/14]
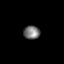
[im 7/14]
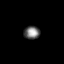
[im 7/14]
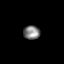
[im 8/14]
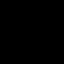
[im 8/14]
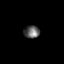
[im 8/14]
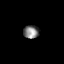
[im 9/14]
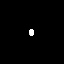
[im 9/14]
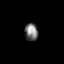
[im 9/14]
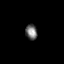
[im 10/14]
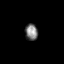
[im 10/14]
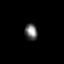
[im 10/14]
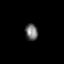
[im 11/14]
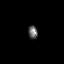
[im 11/14]
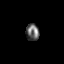
[im 11/14]
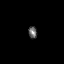
[im 12/14]
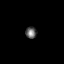
[im 12/14]
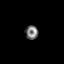
[im 12/14]
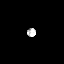
[im 14/14]
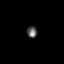
[im 14/14]
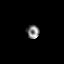
[im 14/14]
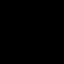

[60 of 60 positions shown; findings below may reference images not displayed]

FINDINGS: Pharmacological Stress Electrocardiogram:  The patient's resting heart rate was 63 bpm and the
resting blood pressure was 146/88.  The patient?s peak stress heart rate was 86 bpm and the peak
stress blood pressure was 136/83.

With pharmacological stress the patient noted shortness of air.  Baseline electrocardiogram shows
normal sinus rhythm.  With pharmacological stress there are no ST segment changes.
CONCLUSION: This is a nonischemic pharmacological stress electrocardiogram.

Pulmonary-to-Myocardial Count Ratio:  0.33  (normal = or < 0.52).

Scintigraphic (planar/tomographic):

TID Ratio:  1.01  (normal <1.36).

Summed Stress Score:  0   , Summed Rest Score:  0

Post-rest projection and tomographic reconstructions show normal lung uptake no transit dilatation.
Quantitative polar maps are statistically normal.

Regional Wall Thickening and Motion Post Stress: No definite thickening or motion abnormality.

Left Ventricular Ejection Fraction (post stress, in the resting state) =  76 %.

Left Ventricular End Diastolic Volume: 67 mL
SUMMARY/OPINION:   This study is normal.  No definite perfusion defects are present.  Global left
ventricular function is within normal limits.  High risk indicators are not noted.

In aggregate the current study is low risk in regards to predicted annual cardiovascular mortality
rate.

Tech Notes:

## 2020-05-21 ENCOUNTER — Encounter: Admit: 2020-05-21 | Discharge: 2020-05-21 | Payer: MEDICAID | Primary: Family

## 2020-05-22 ENCOUNTER — Encounter: Admit: 2020-05-22 | Discharge: 2020-05-22 | Payer: MEDICAID | Primary: Family

## 2020-05-24 ENCOUNTER — Encounter: Admit: 2020-05-24 | Discharge: 2020-05-24 | Payer: MEDICAID | Primary: Family

## 2020-05-27 ENCOUNTER — Encounter: Admit: 2020-05-27 | Discharge: 2020-05-27 | Payer: MEDICAID | Primary: Family

## 2020-05-28 ENCOUNTER — Encounter: Admit: 2020-05-28 | Discharge: 2020-05-28 | Payer: MEDICAID | Primary: Family

## 2020-07-30 ENCOUNTER — Encounter: Admit: 2020-07-30 | Discharge: 2020-07-30 | Payer: MEDICAID | Primary: Family

## 2020-07-30 DIAGNOSIS — R079 Chest pain, unspecified: Secondary | ICD-10-CM

## 2020-07-30 NOTE — Telephone Encounter
-----   Message from Lauralee Evener, RN sent at 07/30/2020  2:25 PM CDT -----  Hello,    Dr. Sandria Manly would like for this patient to have a CCTA for chest pain. I apologize, I'm not sure I got the right order. Can you please look at it and let me know? Also, you call the patient to instruct and schedule?  Thanks for your assistance,  Shawna Orleans, Charity fundraiser

## 2020-07-30 NOTE — Patient Instructions
Thank you for visiting our office today.    Continue the same medications as you have been doing.          We will be pursuing the following tests after your appointment today:       Orders Placed This Encounter    LIMITED CT CHEST W CARDIAC CT    CT CORONARY CTA W CONTRAST    ECG Today (all locations)    2D + DOPPLER ECHO         We will plan to see you back in June.  Please call us in the meantime with any questions or concerns.        Please allow 5-7 business days for our providers to review your results. All normal results will go to MyChart. If you do not have Mychart, it is strongly recommended to get this so you can easily view all your results. If you do not have mychart, we will attempt to call you once with normal lab and testing results. If we cannot reach you by phone with normal results, we will send you a letter.  If you have not heard the results of your testing after one week please give [place] a call.       Your Cardi[facility] [place] Team Brett Canales, Pilar Jarvis and [place])  phone number is [phone].

## 2020-08-02 ENCOUNTER — Encounter: Admit: 2020-08-02 | Discharge: 2020-08-02 | Payer: MEDICAID | Primary: Family

## 2020-08-06 ENCOUNTER — Encounter: Admit: 2020-08-06 | Discharge: 2020-08-06 | Payer: MEDICAID | Primary: Family

## 2020-08-06 ENCOUNTER — Ambulatory Visit: Admit: 2020-08-06 | Discharge: 2020-08-06 | Payer: MEDICAID | Primary: Family

## 2020-08-06 DIAGNOSIS — R079 Chest pain, unspecified: Secondary | ICD-10-CM

## 2020-08-15 IMAGING — US ABDLM
1 series · 14 of 22 positions shown · non-contrast
Comparison: none

[Series 1: us abdomen limited · 14 of 22 slices shown]
[im 1/22]
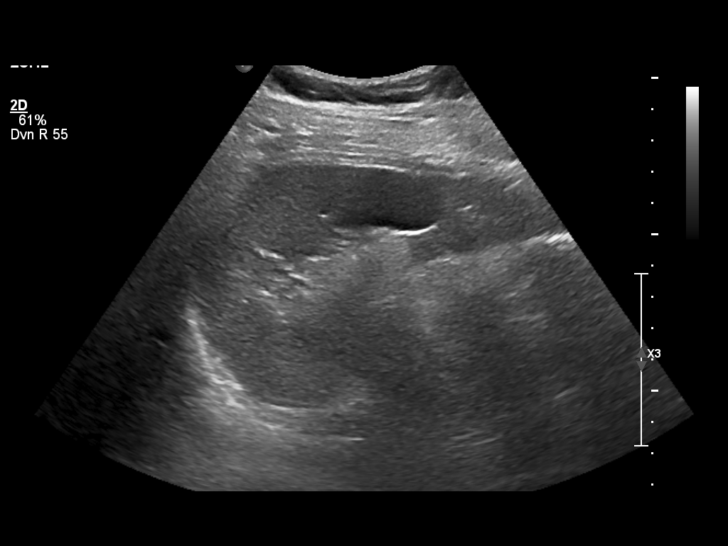
[im 3/22]
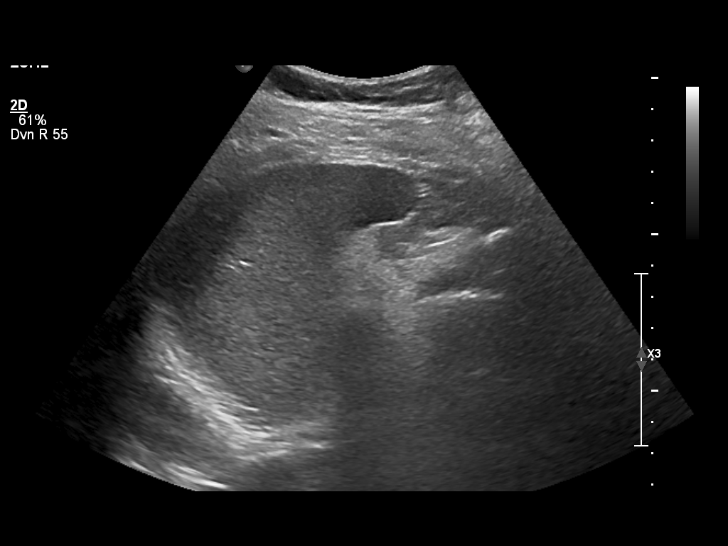
[im 4/22]
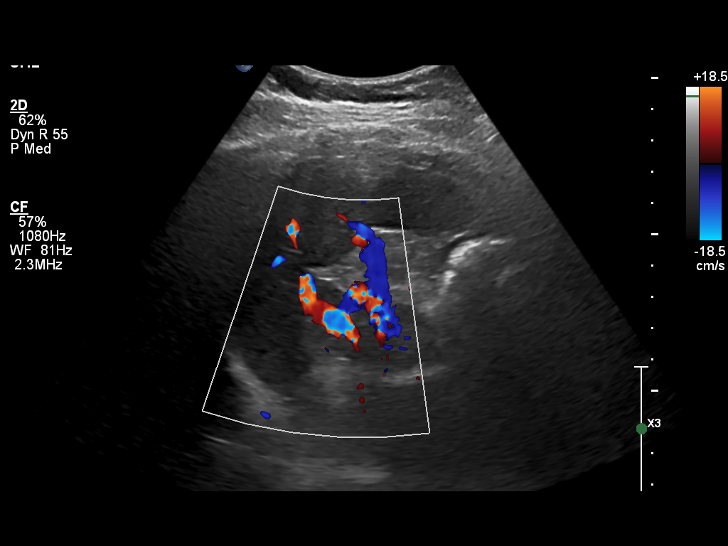
[im 6/22]
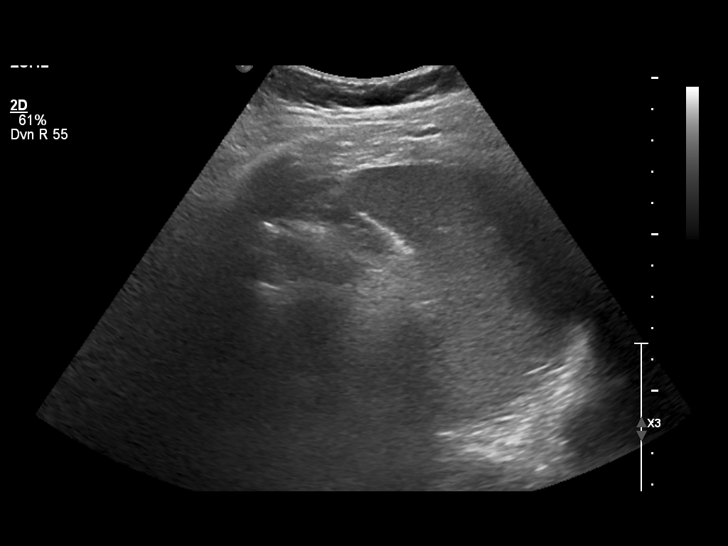
[im 8/22]
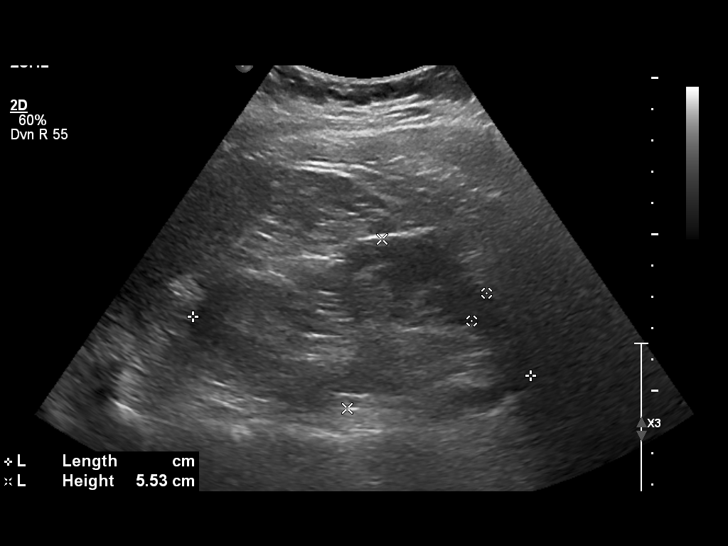
[im 9/22]
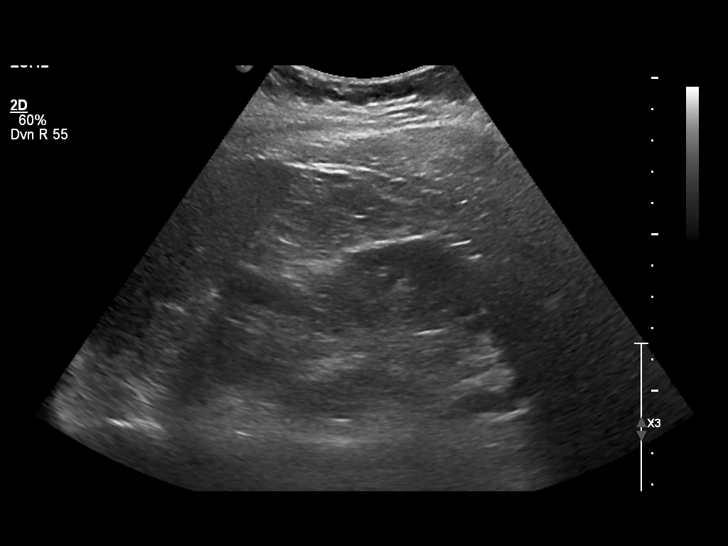
[im 11/22]
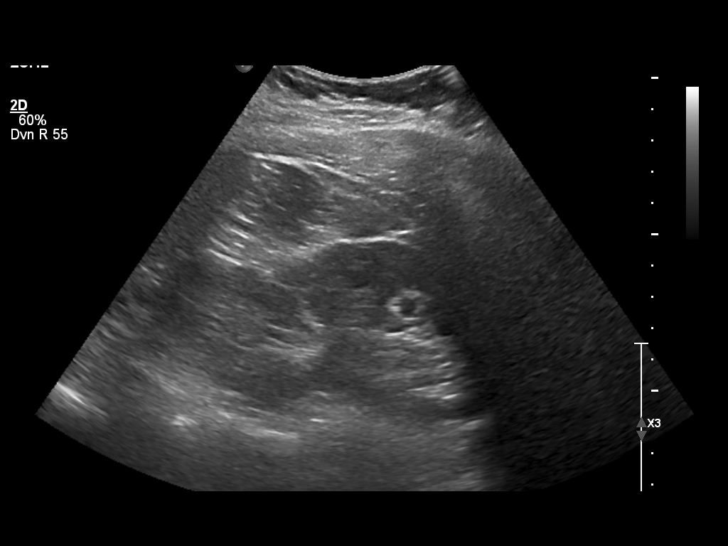
[im 12/22]
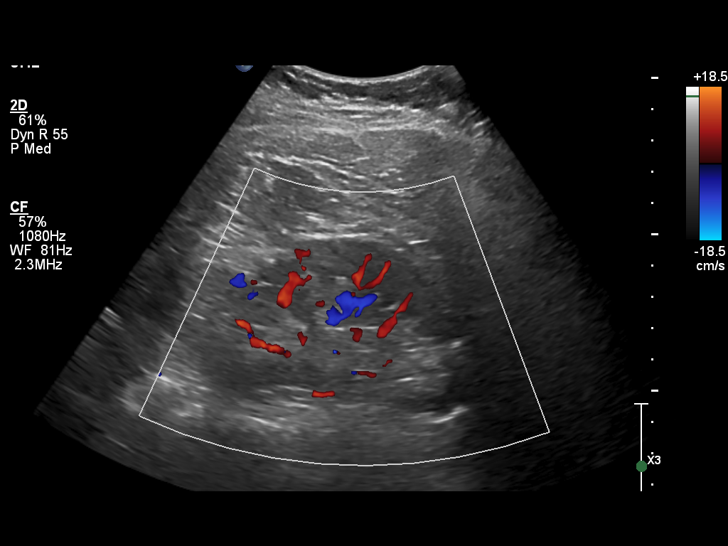
[im 14/22]
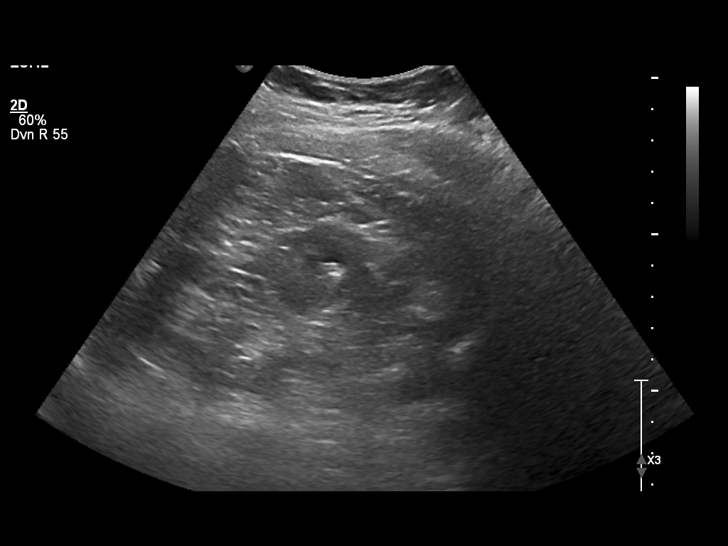
[im 15/22]
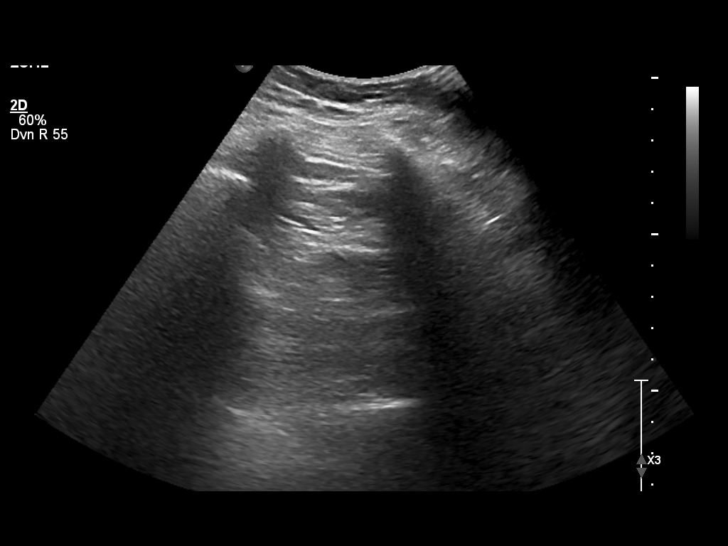
[im 17/22]
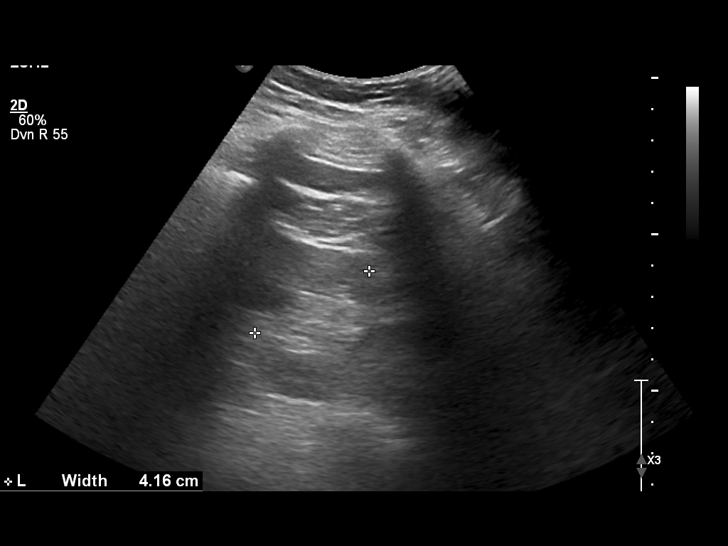
[im 19/22]
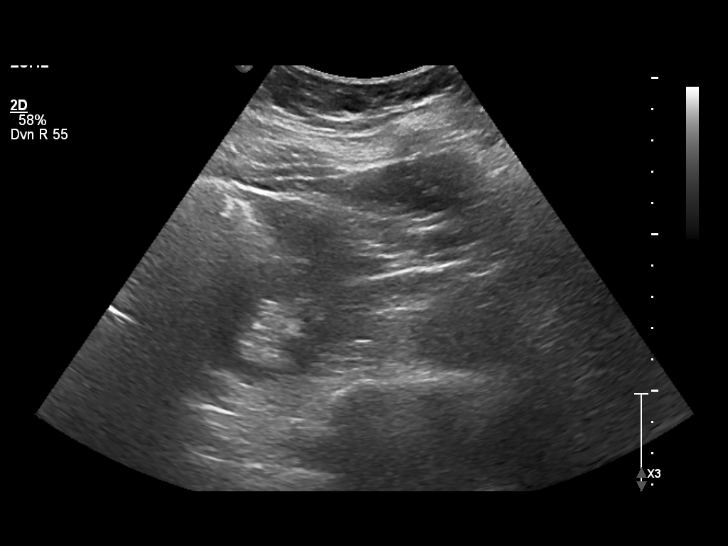
[im 20/22]
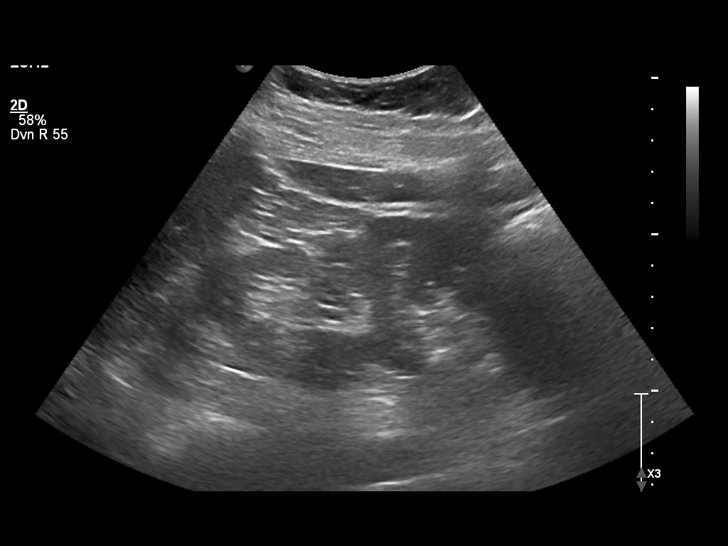
[im 22/22]
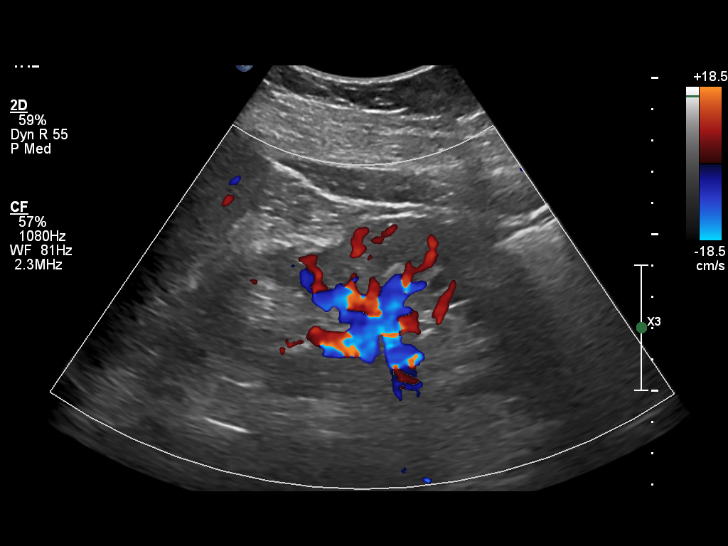

[14 of 22 positions shown; findings below may reference images not displayed]

DIAGNOSTIC STUDIES

EXAM

US abdomen limited

INDICATION

LUQ abd pain
luq pain x4months

COMPARISONS

None

FINDINGS

Focused ultrasound of the left upper quadrant demonstrates a normal appearing spleen which measures
up to 7.9 cm.

The left kidney measures 11.0 x 5.5 x 4.1 cm. No mass or complex appearing cyst is seen. No
hydronephrosis or hydroureter.

IMPRESSION

Normal ultrasound the left upper quadrant.

Tech Notes:

luq pain x4months

## 2020-08-19 ENCOUNTER — Encounter: Admit: 2020-08-19 | Discharge: 2020-08-19 | Payer: MEDICAID | Primary: Family

## 2020-08-20 ENCOUNTER — Encounter: Admit: 2020-08-20 | Discharge: 2020-08-20 | Payer: MEDICAID | Primary: Family

## 2020-09-19 IMAGING — MR Foot^Routine
4 series · 22 of 22 positions shown · non-contrast
Comparison: none

[Series 5: PD fat-sat · oblique · 4.0mm · 0.62mm/px · 3 of 3 slices shown (1 of 2)]
[im 1/3]
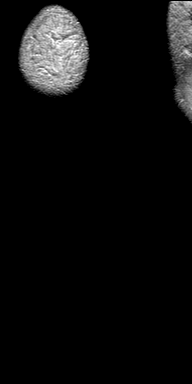
[im 2/3]
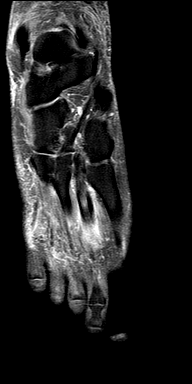
[im 3/3]
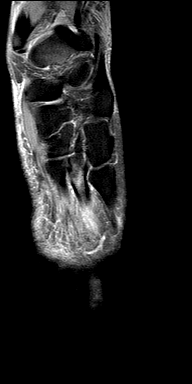

[Series 8: T1 · sagittal · 3.5mm · 0.69mm/px · 9 of 9 slices shown (1 of 2)]
[im 1/9]
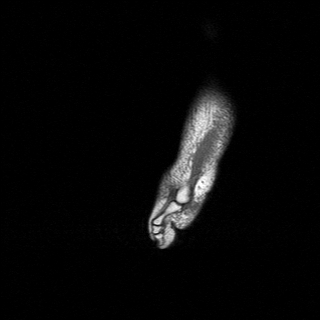
[im 2/9]
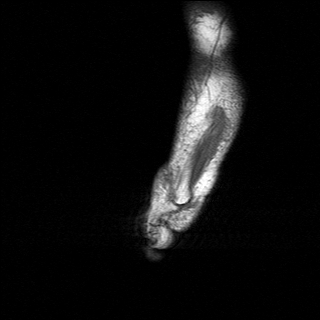
[im 3/9]
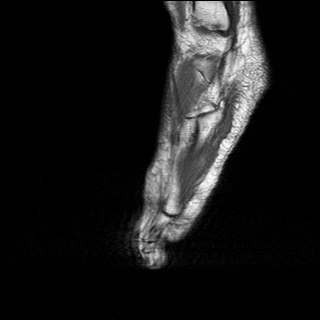
[im 4/9]
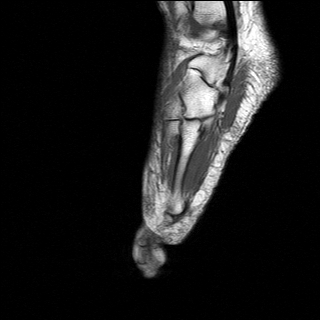
[im 5/9]
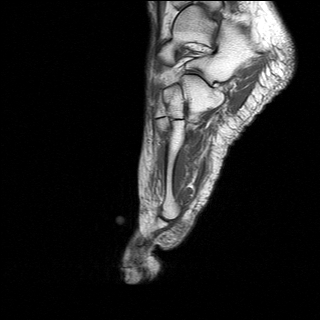
[im 6/9]
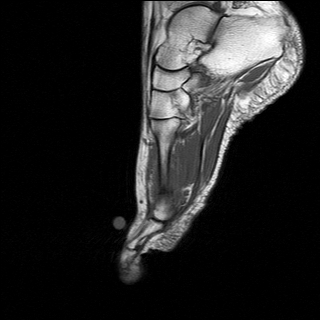
[im 7/9]
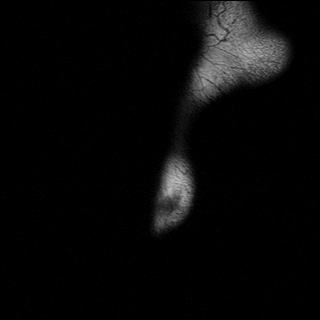
[im 8/9]
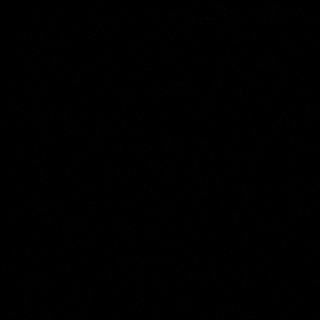
[im 9/9]
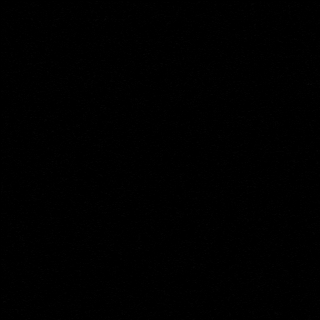

[Series 11: PD fat-sat · axial · 4.5mm · 0.47mm/px · z∈[-32,+16]mm · 4 of 4 slices shown (2 of 2)]
[im 1/4]
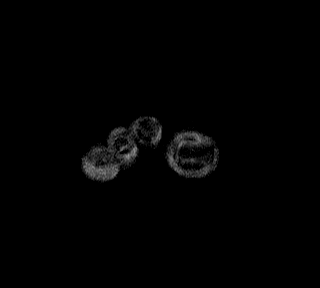
[im 2/4]
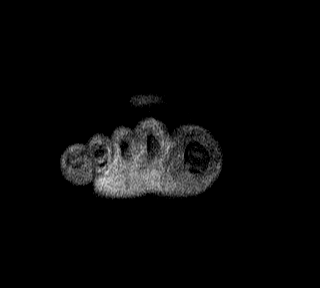
[im 3/4]
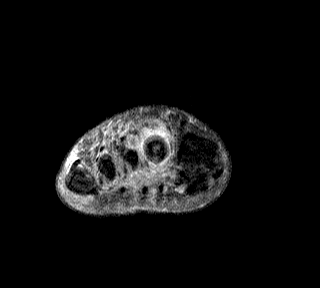
[im 4/4]
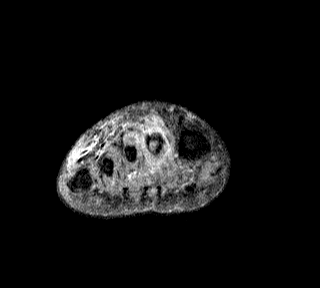

[Series 14: T1 · sagittal · 3.5mm · 0.69mm/px · 6 of 6 slices shown (2 of 2)]
[im 1/6]
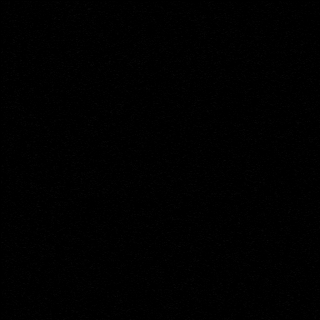
[im 2/6]
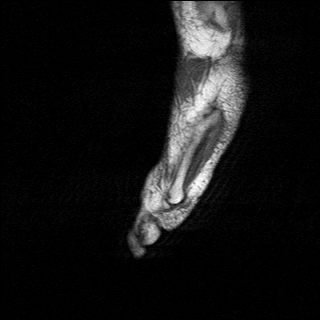
[im 3/6]
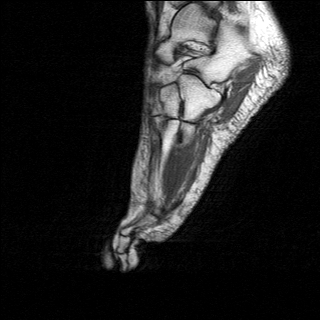
[im 4/6]
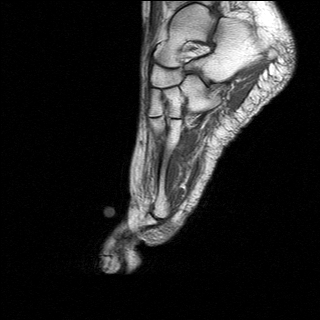
[im 5/6]
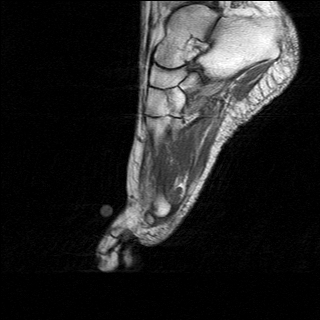
[im 6/6]
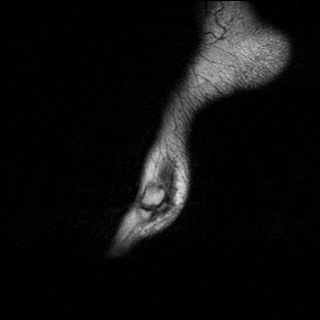

[22 of 22 positions shown; findings below may reference images not displayed]

DIAGNOSTIC STUDIES

EXAM

MRI of the right foot without contrast.

INDICATION

foot pain
RT FOOT PAIN, TOWARDS BASE OF TOES, PT IS UNSURE IF SHE DROPPED SOMETHING ON IT.  HURTS WITH
WALKING.  RG

TECHNIQUE

Sagittal, axial, and coronal images were obtained with variable T1 and T2 weighting.

COMPARISONS

None available

FINDINGS

There is a nondisplaced stress type fracture involving the distal 2nd metatarsal image 10 series 6.
Associated marrow edema can be identified throughout the shaft of the 2nd metatarsal. No additional
fractures are seen. Remaining osseous structures are normal. The visualized flexor and extensor
tendons are unremarkable. No abnormal fluid collections or soft tissue mass lesions are seen.

IMPRESSION

Nondisplaced stress fracture involving the distal 2nd metatarsal with associated soft tissue and
bone marrow edema.

Tech Notes:

RT FOOT PAIN, TOWARDS BASE OF TOES, PT IS UNSURE IF SHE DROPPED SOMETHING ON IT.  HURTS WITH
WALKING.  RG

## 2020-11-07 IMAGING — CR [ID]
3 series · 3 of 3 positions shown · non-contrast
Comparison: none

[foot ap]
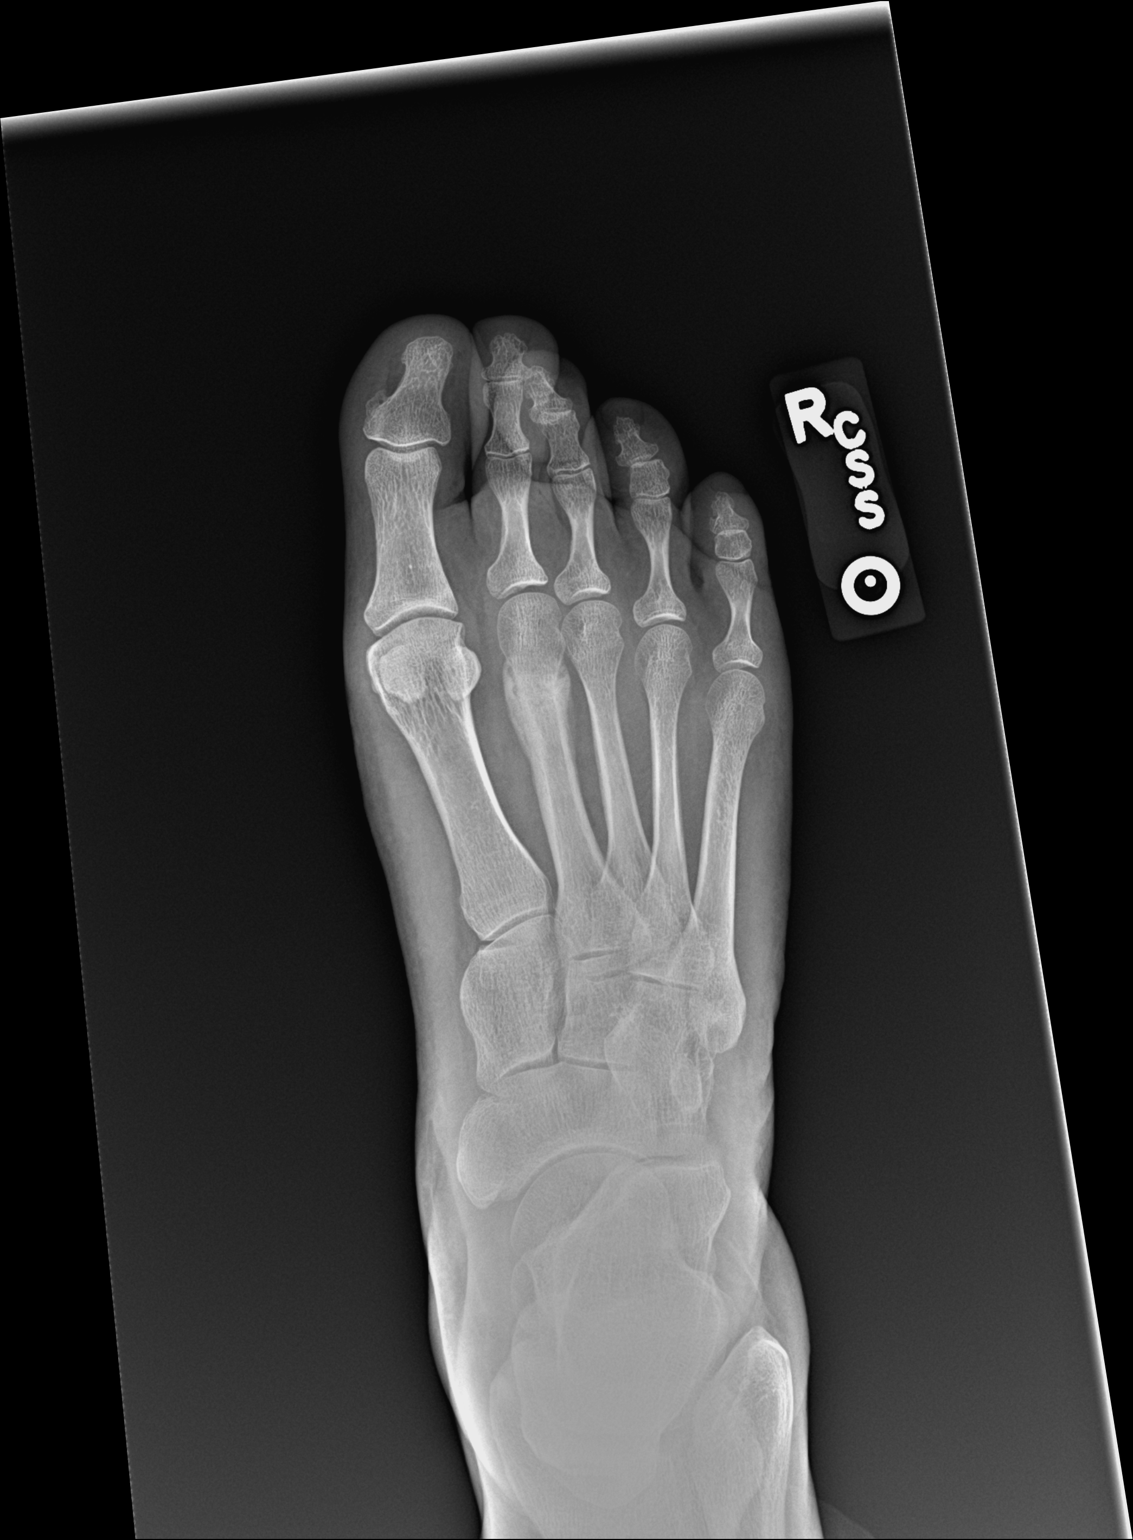

[foot obl]
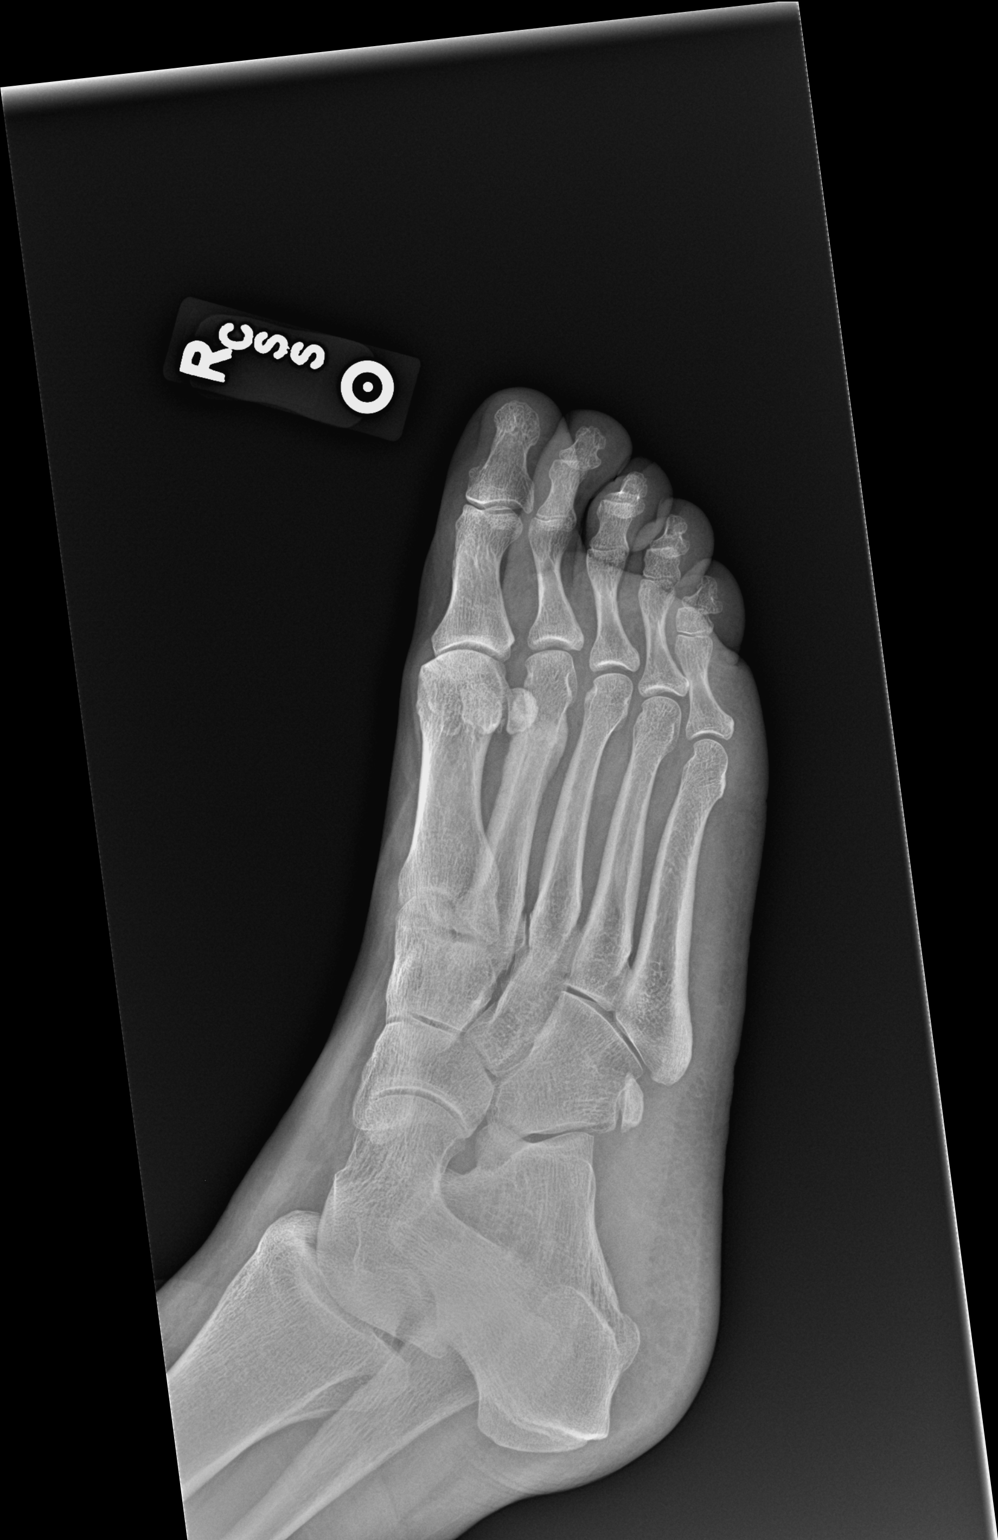

[foot lat]
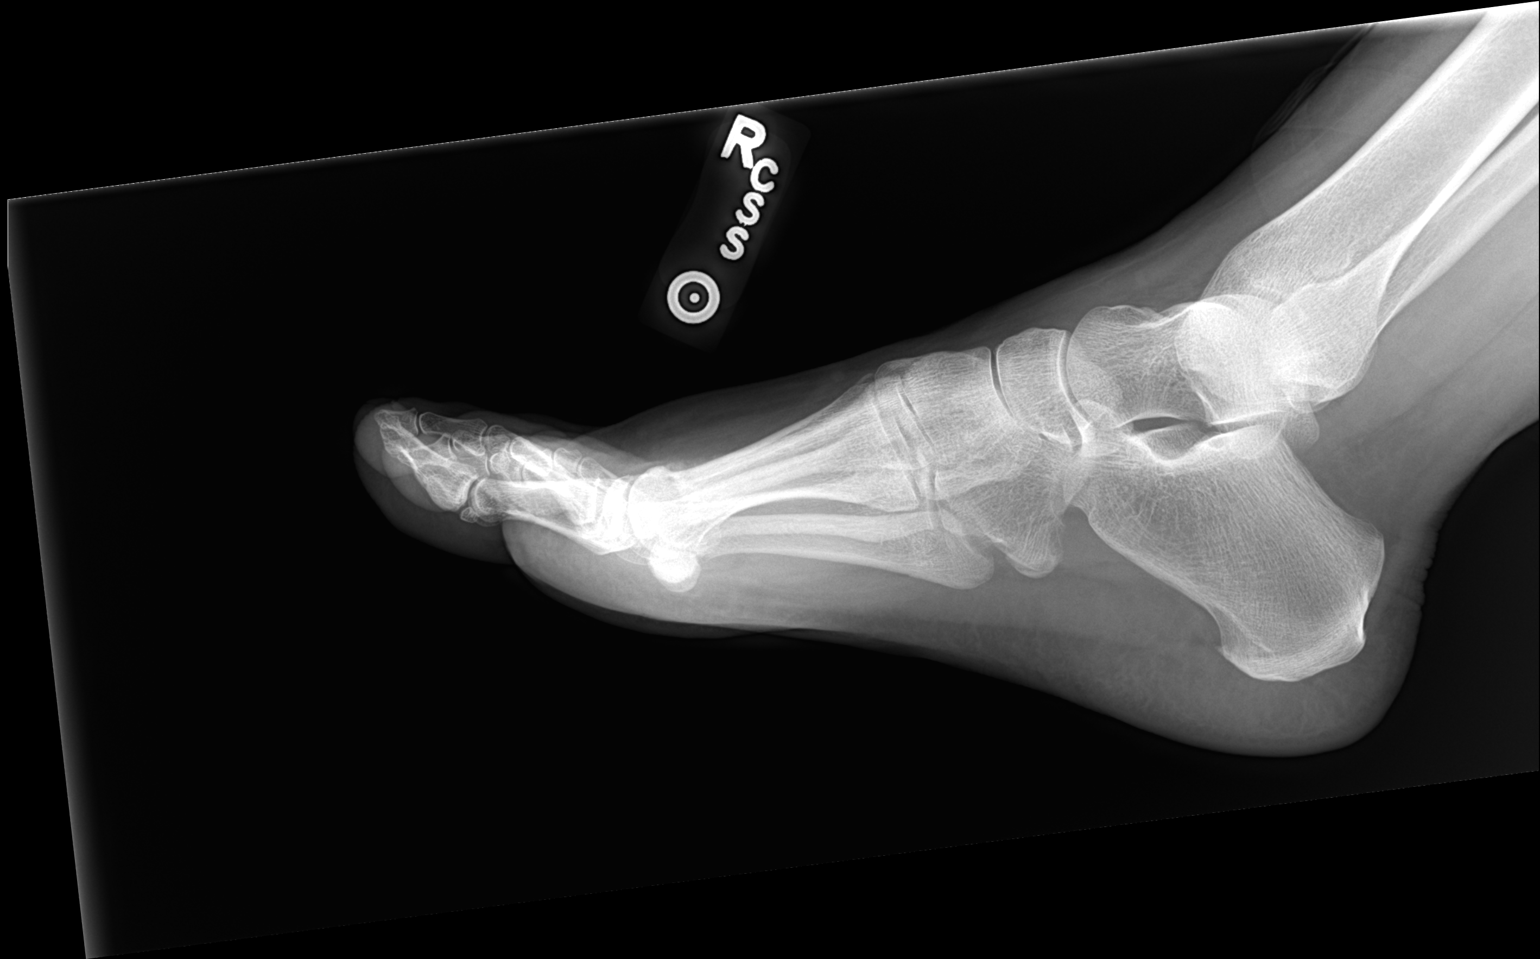

[3 of 3 positions shown; findings below may reference images not displayed]

DIAGNOSTIC STUDIES

EXAM

XR foot RT min 3V

INDICATION

fracture follow-up
Pt states she must  have dropped something on foot about 2 months ago. Pt states she is still
having pain especially with walking under th e3rd,4th and 5th toes. CS

TECHNIQUE

AP lateral and oblique views right foot

COMPARISONS

August 19, 2020

FINDINGS

Healing nondisplaced nonangulated fracture of the distal 2nd metatarsal is noted. No new fractures
are seen. There is extensive callus formation.

IMPRESSION

Healing 2nd metatarsal fracture.

Tech Notes:

Pt states she must  have dropped something on foot about 2 months ago. Pt states she is still having
pain especially with walking under th e3rd,4th and 5th toes. CS

## 2020-12-18 ENCOUNTER — Encounter: Admit: 2020-12-18 | Discharge: 2020-12-18 | Payer: MEDICAID | Primary: Family

## 2021-01-16 IMAGING — US PELCM
1 series · 13 of 25 positions shown · non-contrast
Comparison: none

[Series 1: us pelvic complete · 13 of 113 slices shown]
[im 1/113]
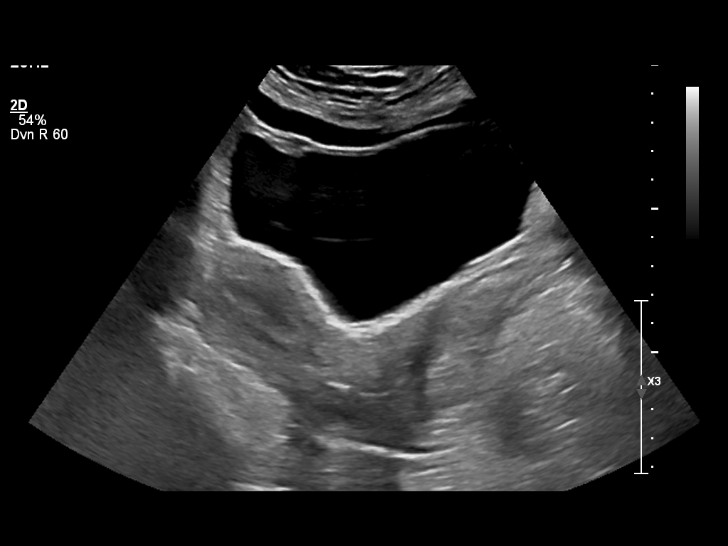
[im 10/113]
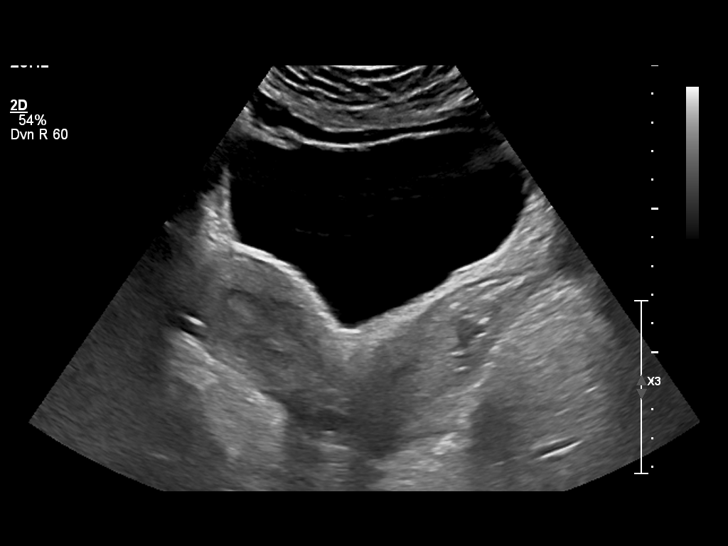
[im 19/113]
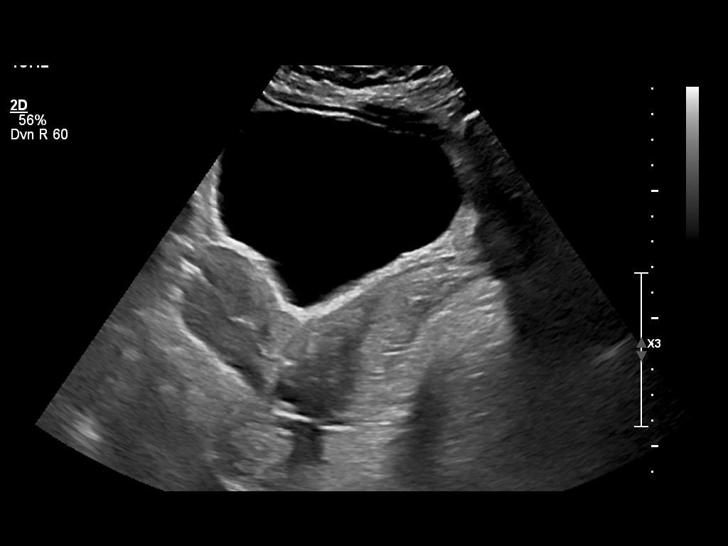
[im 29/113]
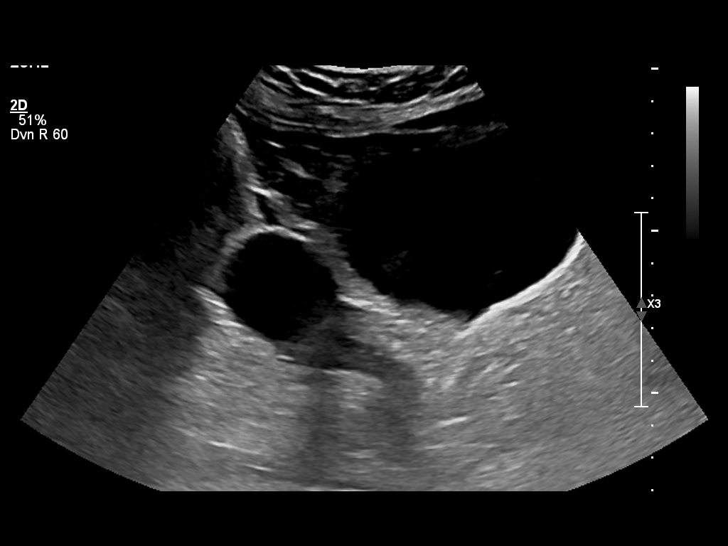
[im 38/113]
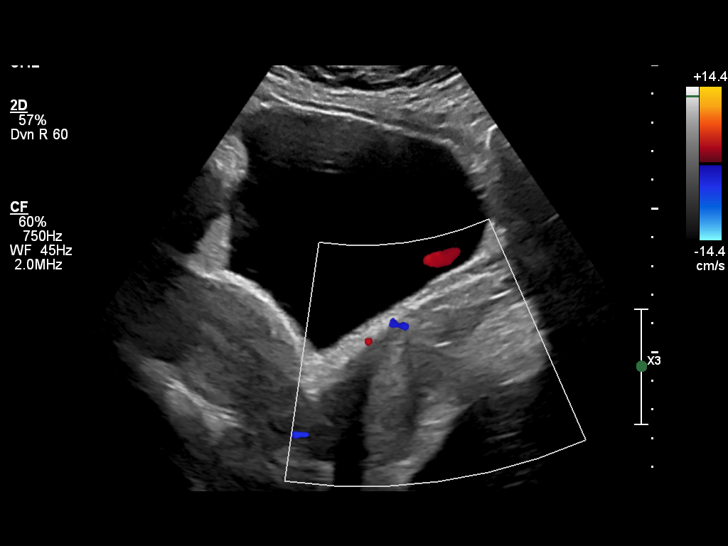
[im 47/113]
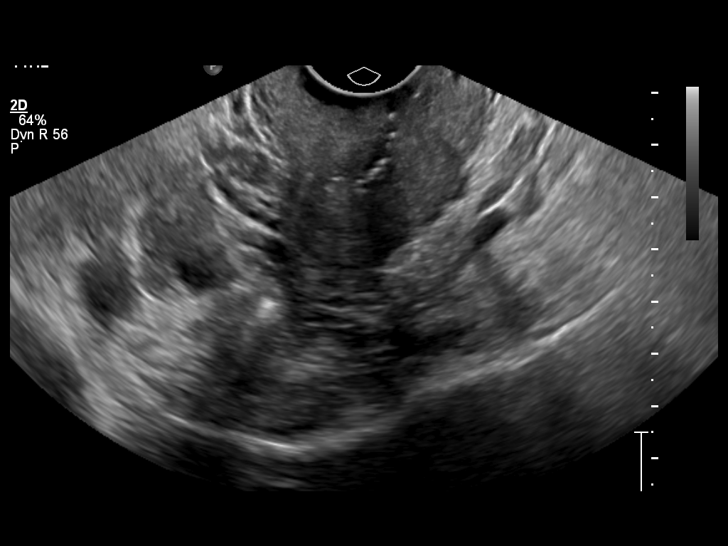
[im 57/113]
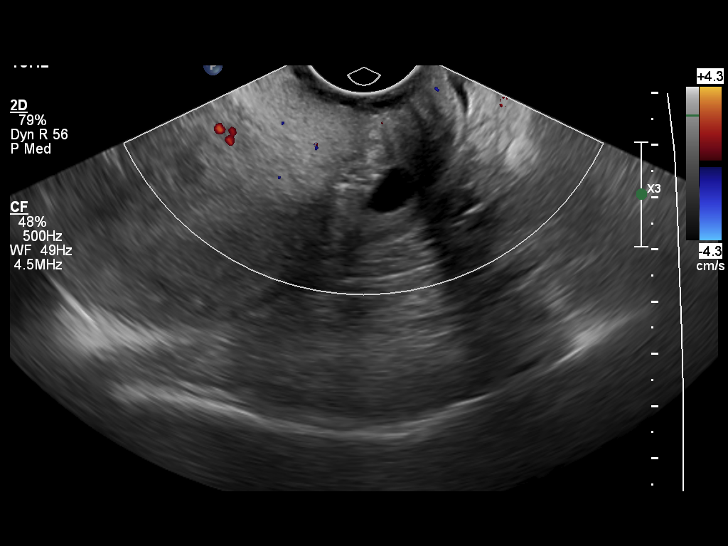
[im 66/113]
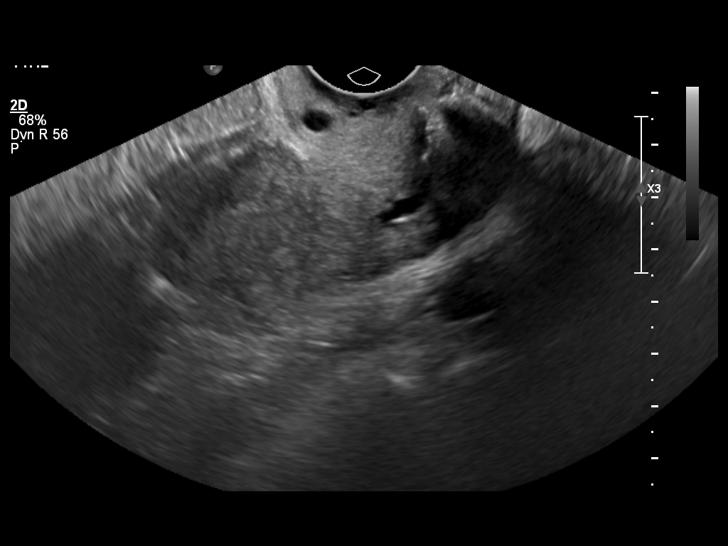
[im 75/113]
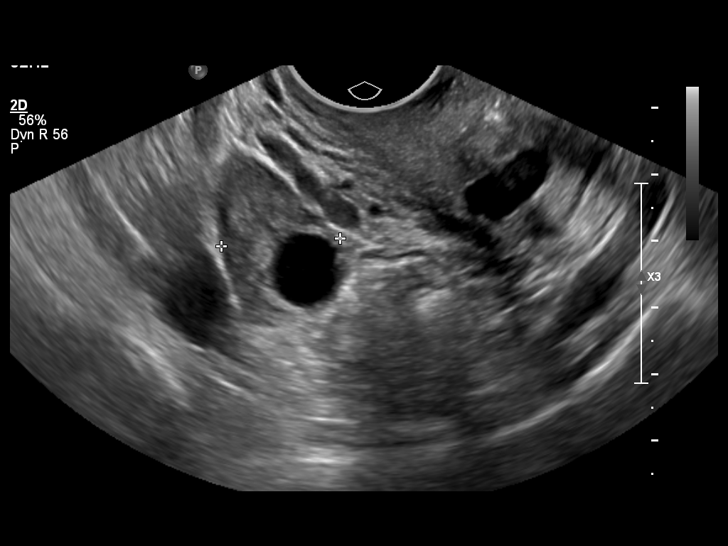
[im 85/113]
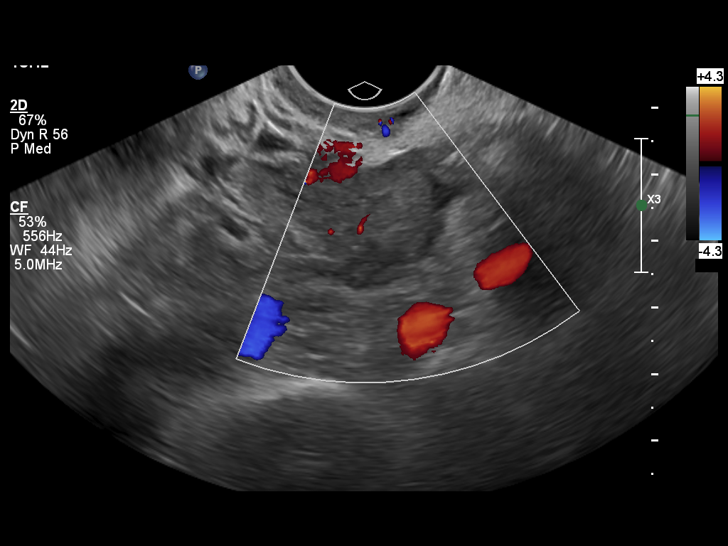
[im 94/113]
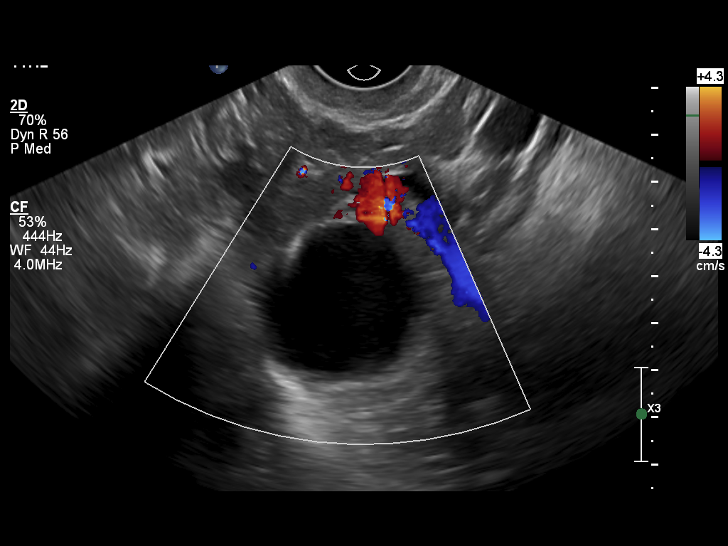
[im 103/113]
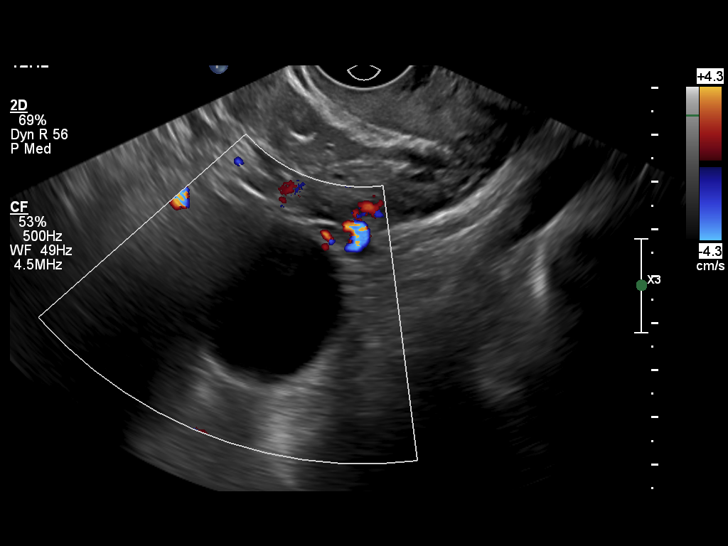
[im 113/113]
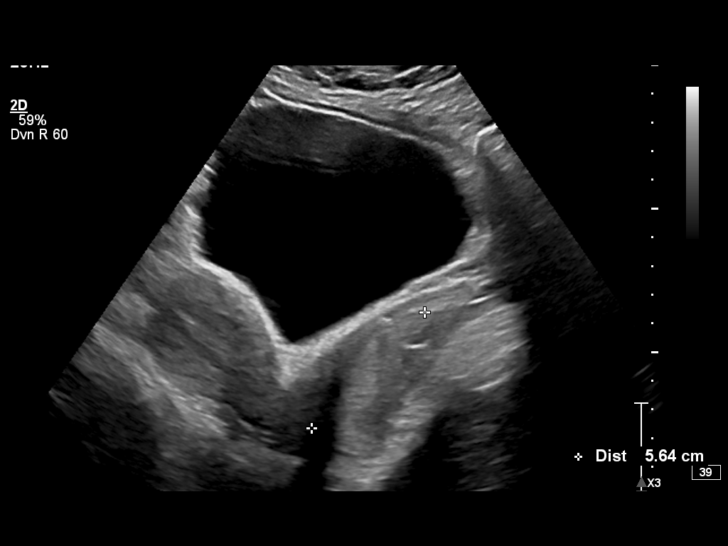

[13 of 25 positions shown; findings below may reference images not displayed]

DIAGNOSTIC STUDIES

EXAM

US transvaginal non ob

INDICATION

EXCESSIVE VAGINAL BLEEDING
EXCESSIVE VAGINAL BLEEDING; LMP 01/16/21; G8P2

TECHNIQUE

Transabdominal and transvaginal ultrasound was performed in the usual protocol

COMPARISONS

None

FINDINGS

The uterus measures 9.4 x 3.7 x 5.9 centimeters. The endometrium measures 7 millimeters. In the
cervical region, there is an area of increased echogenicity measuring 3.1 x 3.3 centimeters which do
es not persist on the postvoid images and likely represent a clot within the cervical canal. The
right ovary measures 3.1 x 2.0 x 1.8 centimeters and the left ovary measures 3.9 x 3.9 x
centimeters. There is bilateral ovarian flow identified. There is simple right ovarian cysts. On the
left, there is a 3.2 x 3.1 centimeter slightly complex cyst.

IMPRESSION

The endometrium is normal in thickness.

There is an echogenic focus within the cervical canal measuring 3.1 x 3.3 centimeters which did not
persist on postvoid images and likely representing clot within the cervical canal. Ob GYN
consultation is recommended if the patient's symptoms persist.

There is a slightly complex left ovarian cyst measuring 3.3 x 3.1 centimeters. A 6 week follow-up
ultrasound is recommended

Tech Notes:

EXCESSIVE VAGINAL BLEEDING; LMP 01/16/21; G8P2

## 2021-01-30 IMAGING — CT BRAIN WO(Adult)
2 series · 9 of 13 positions shown, 11 images · non-contrast
Comparison: none

[Series 2: brain ax 5.00 hr40 s3 · axial · 0.33mm/px · z∈[-547,-517]mm · 2 of 4 slices shown]
[im 2/4  brain]
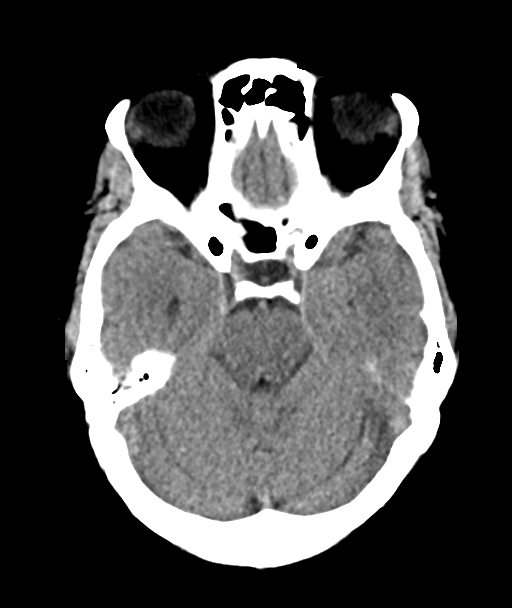
[im 3/4  brain]
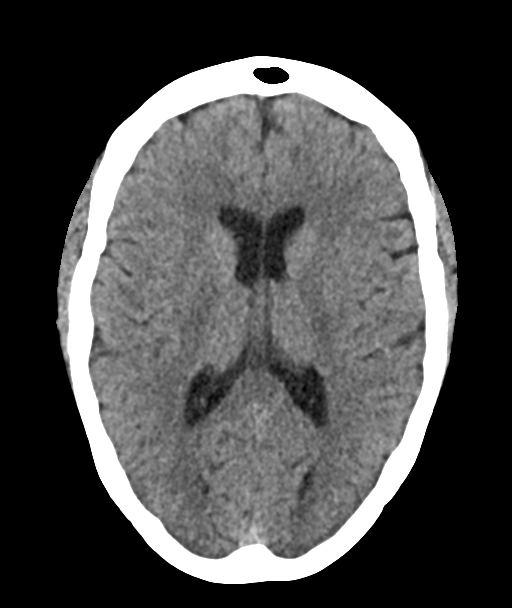

[Series 8: brain ax 2.00 hr60 s3 · axial · 0.33mm/px · z∈[-528,-445]mm · 7 of 9 slices shown, 9 images]
[im 2/9  brain]
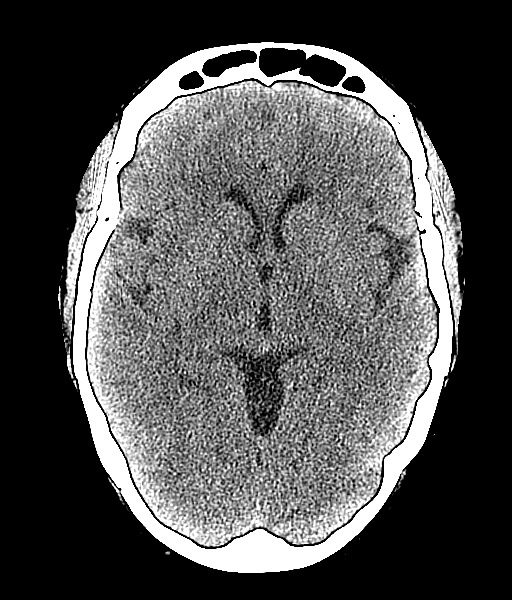
[im 2/9  bone]
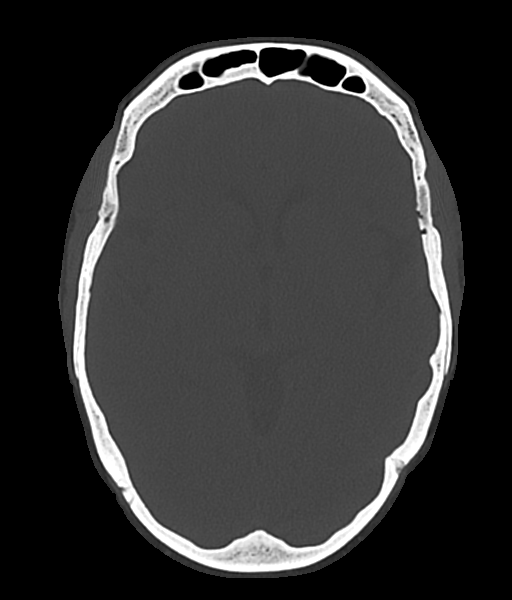
[im 3/9  brain]
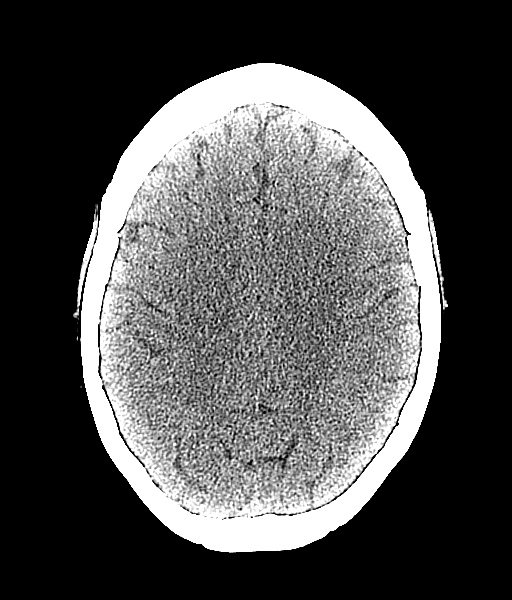
[im 4/9  brain]
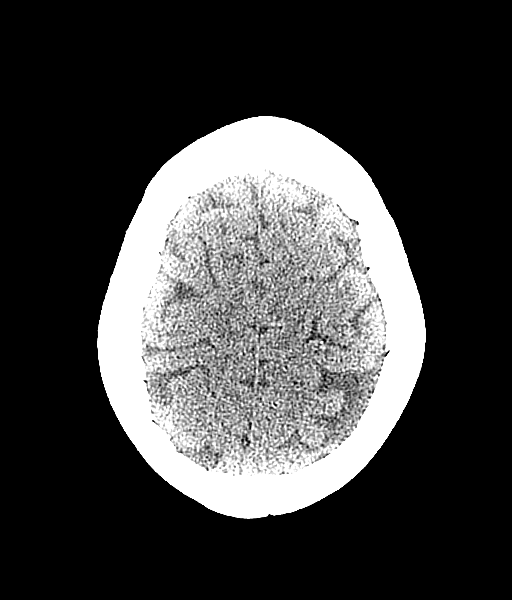
[im 5/9  brain]
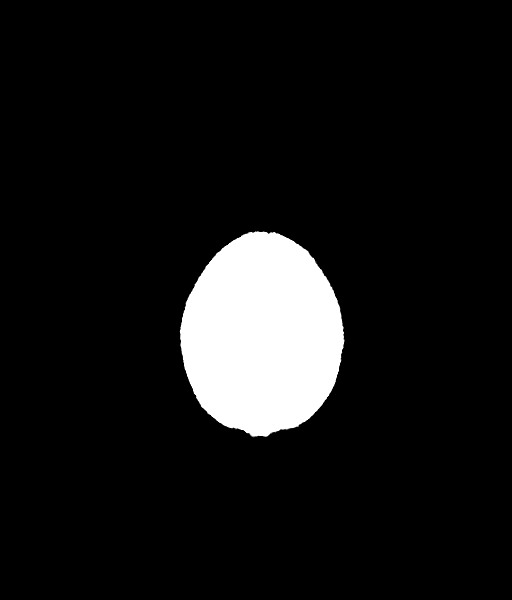
[im 6/9  brain]
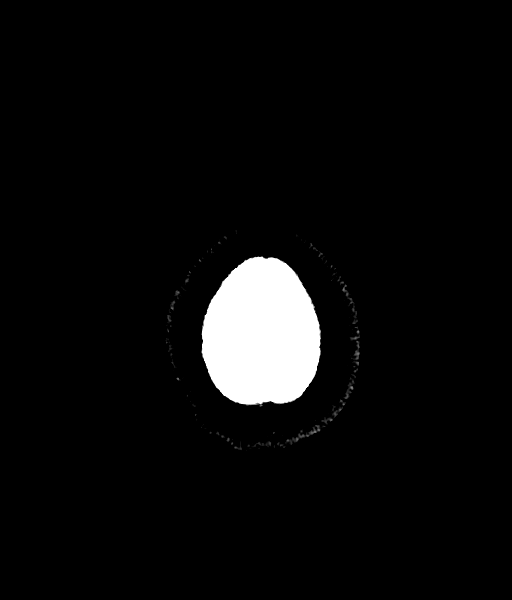
[im 6/9  bone]
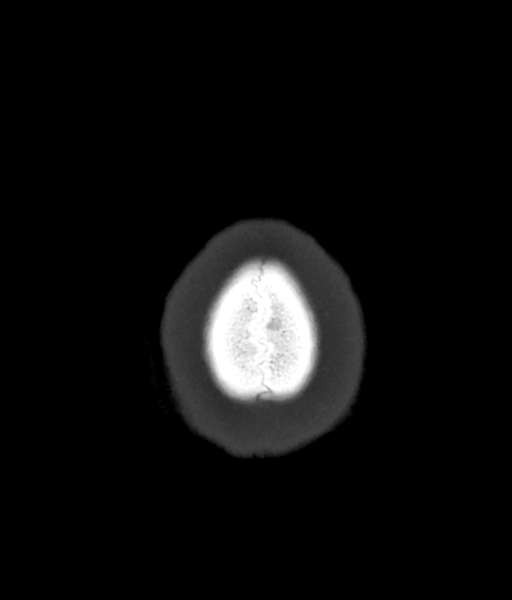
[im 7/9  brain]
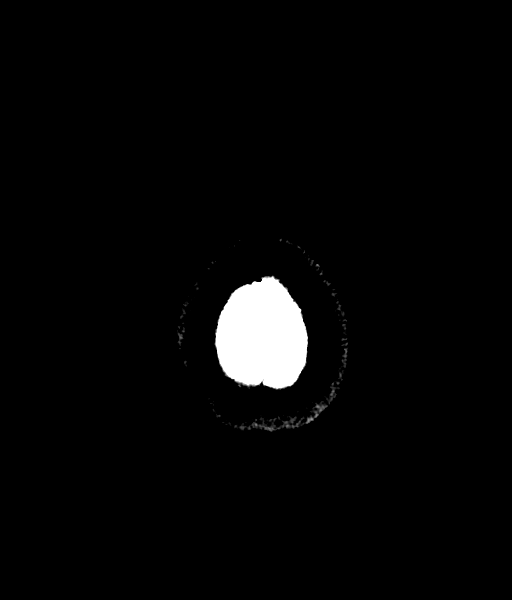
[im 8/9  brain]
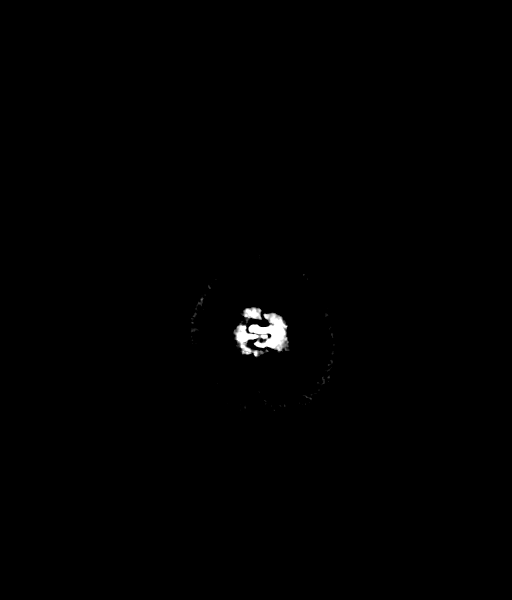

[9 of 13 positions shown; findings below may reference images not displayed]

DIAGNOSTIC STUDIES

EXAM

CT of the head without IV contrast

INDICATION

headache/hypertension
headache/htn. pt states sx hx to her ear. AK

TECHNIQUE

CT of the head was performed without IV contrast in the usual protocol. All CT scans at this
facility use dose modulation, iterative reconstruction, and/or weight based dosing when appropriate
to reduce radiation dose to as low as reasonably achievable.

Number of previous computed tomography exams in the last 12 months is 1 move.

Number of previous nuclear medicine myocardial perfusion studies in the last 12 months is 0  .

COMPARISONS

None

FINDINGS

The ventricles, sulci, and cisterns are unremarkable. No intracranial hemorrhage, mass, or mass
effect. The visualized paranasal sinuses and mastoid air cells are unremarkable.

IMPRESSION

No acute findings. If the patient's symptoms persists, follow-up MRI is recommended.

Tech Notes:

headache/htn. pt states sx hx to her ear. AK

## 2021-02-20 IMAGING — CR SHOULDCMLT
3 series · 3 of 3 positions shown · non-contrast
Comparison: No relevant prior studies available.

DIAGNOSTIC STUDIES

EXAM:  XR LEFT SHOULDER COMPLETE, 2 OR MORE VIEWS  (84141)
INDICATION: pain injury to left shoulder, c/o pain and Ltd ROM. denies preg. AK
TECHNIQUE: Two or more views of the left shoulder.

[shoulder external]
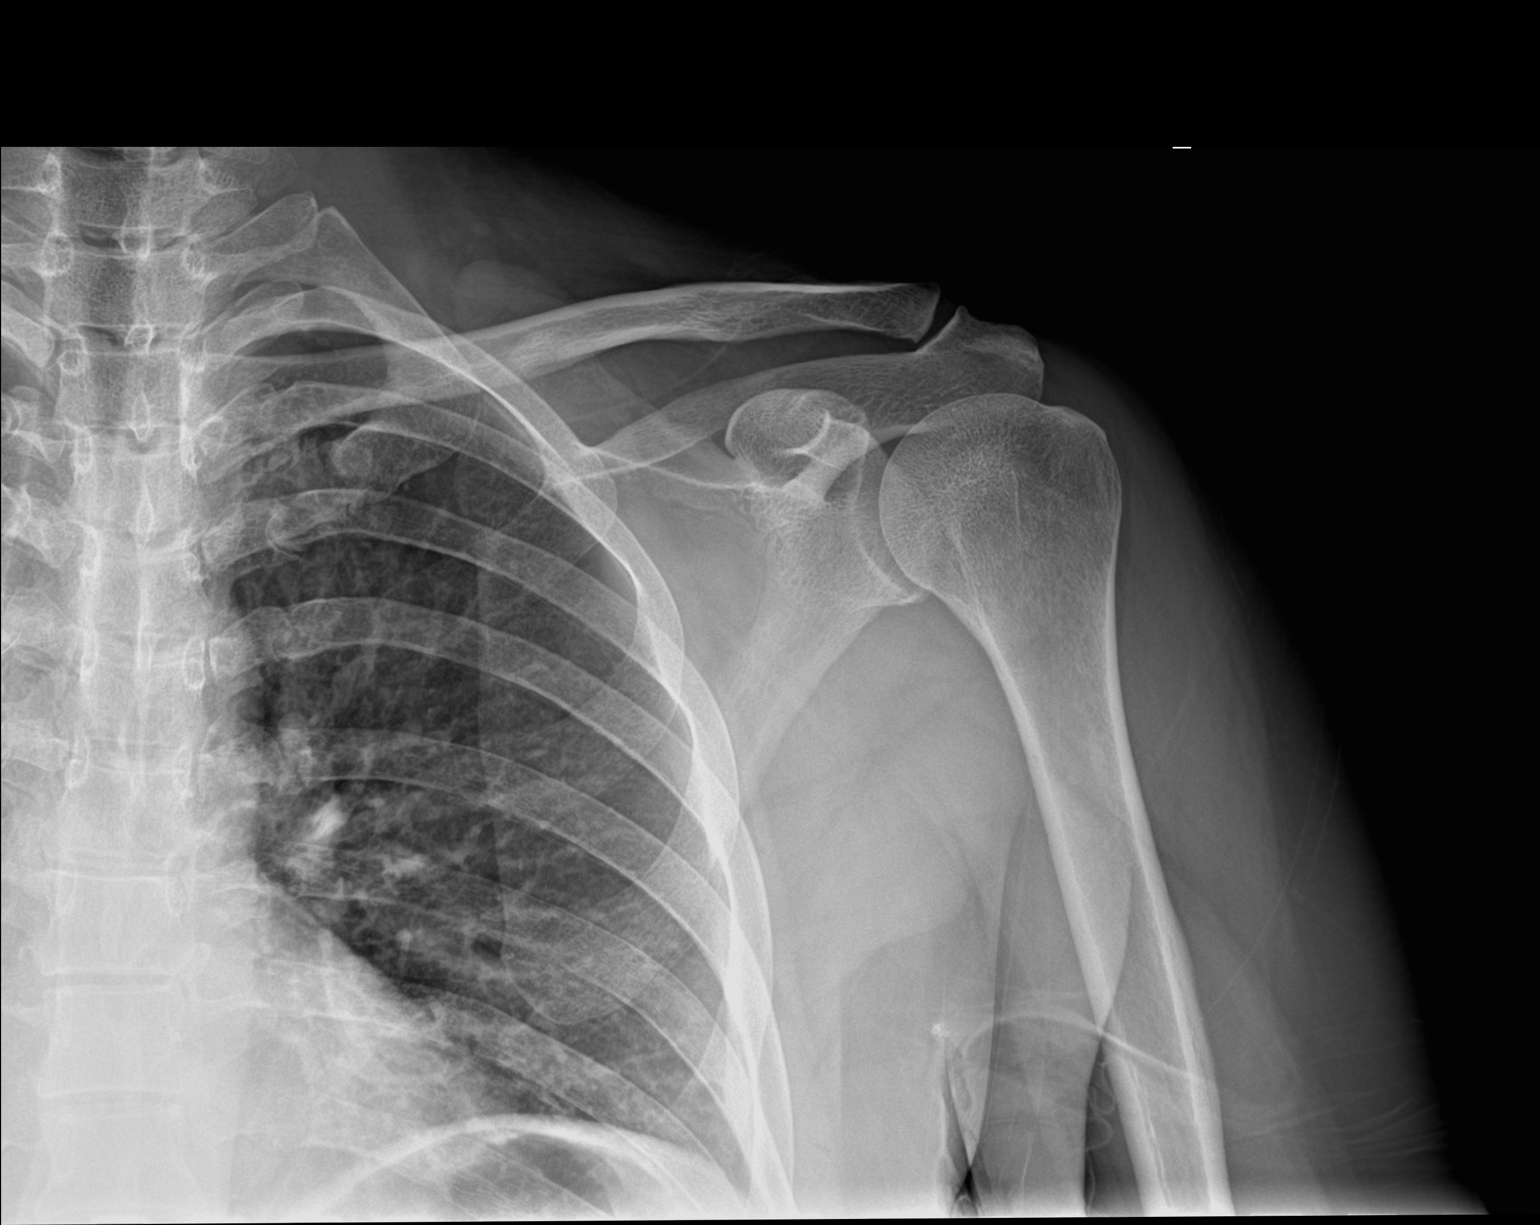

[shoulder internal]
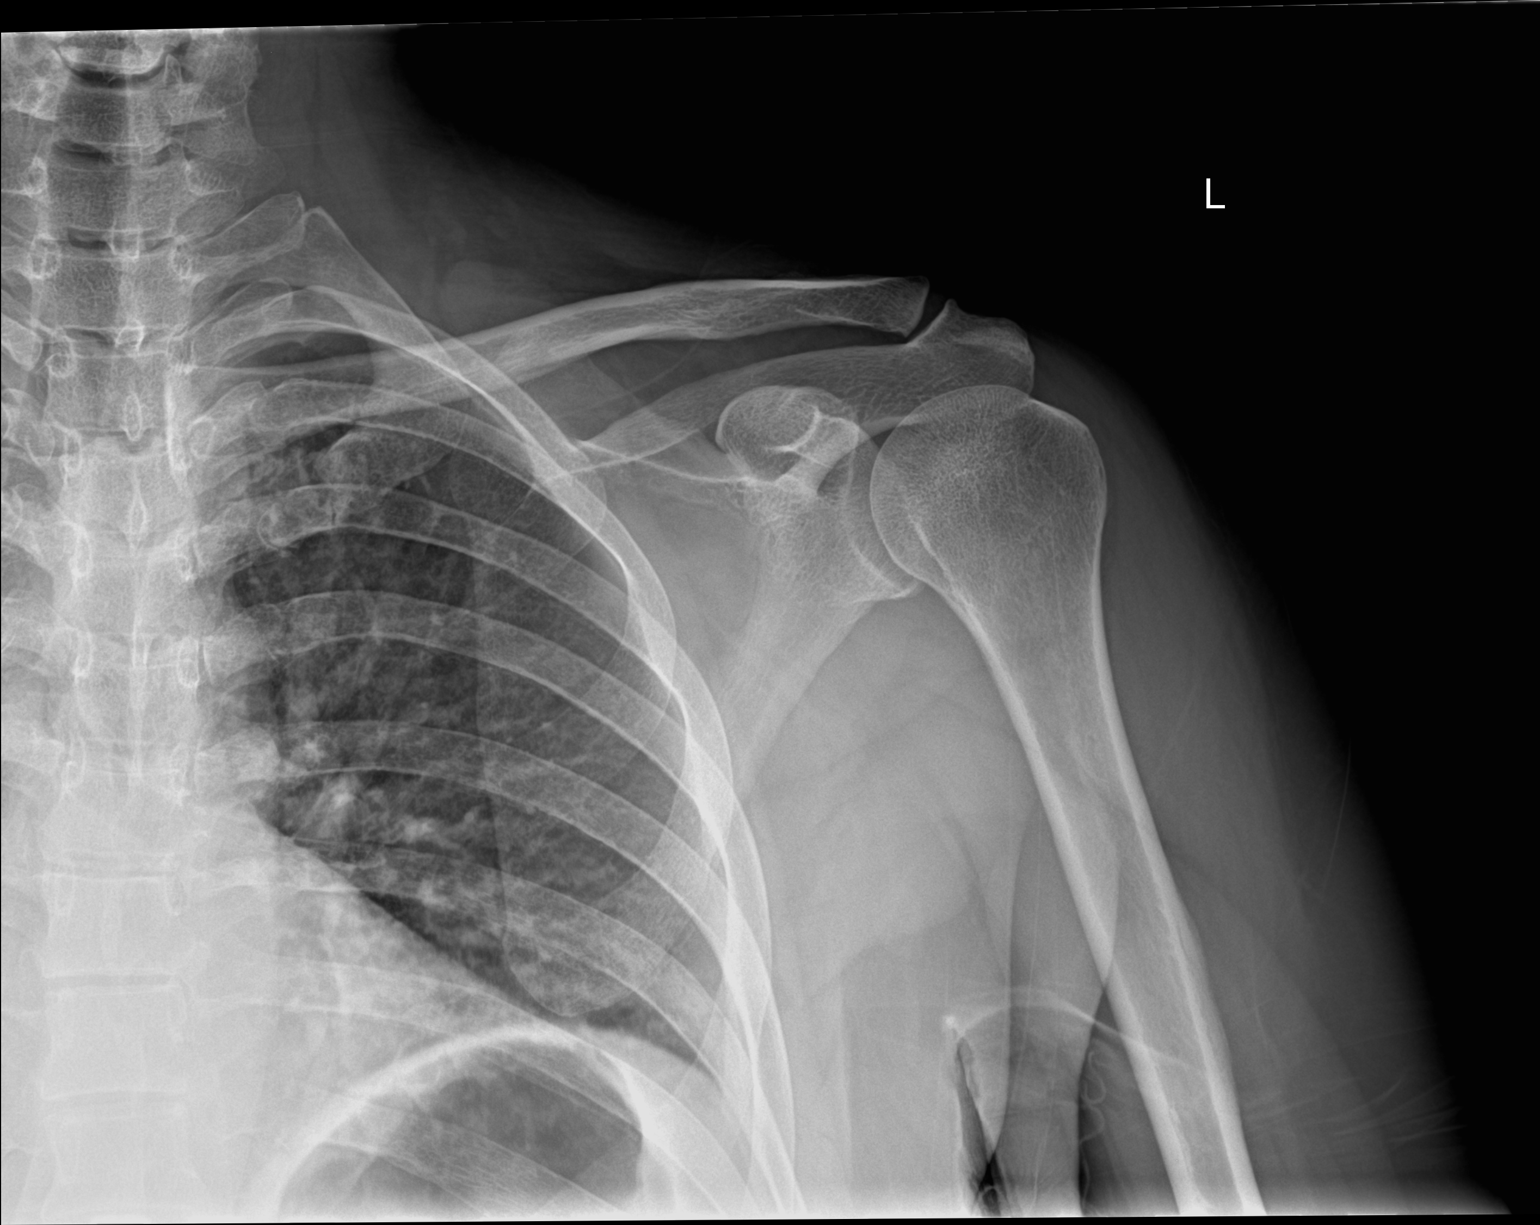

[shoulder y-view]
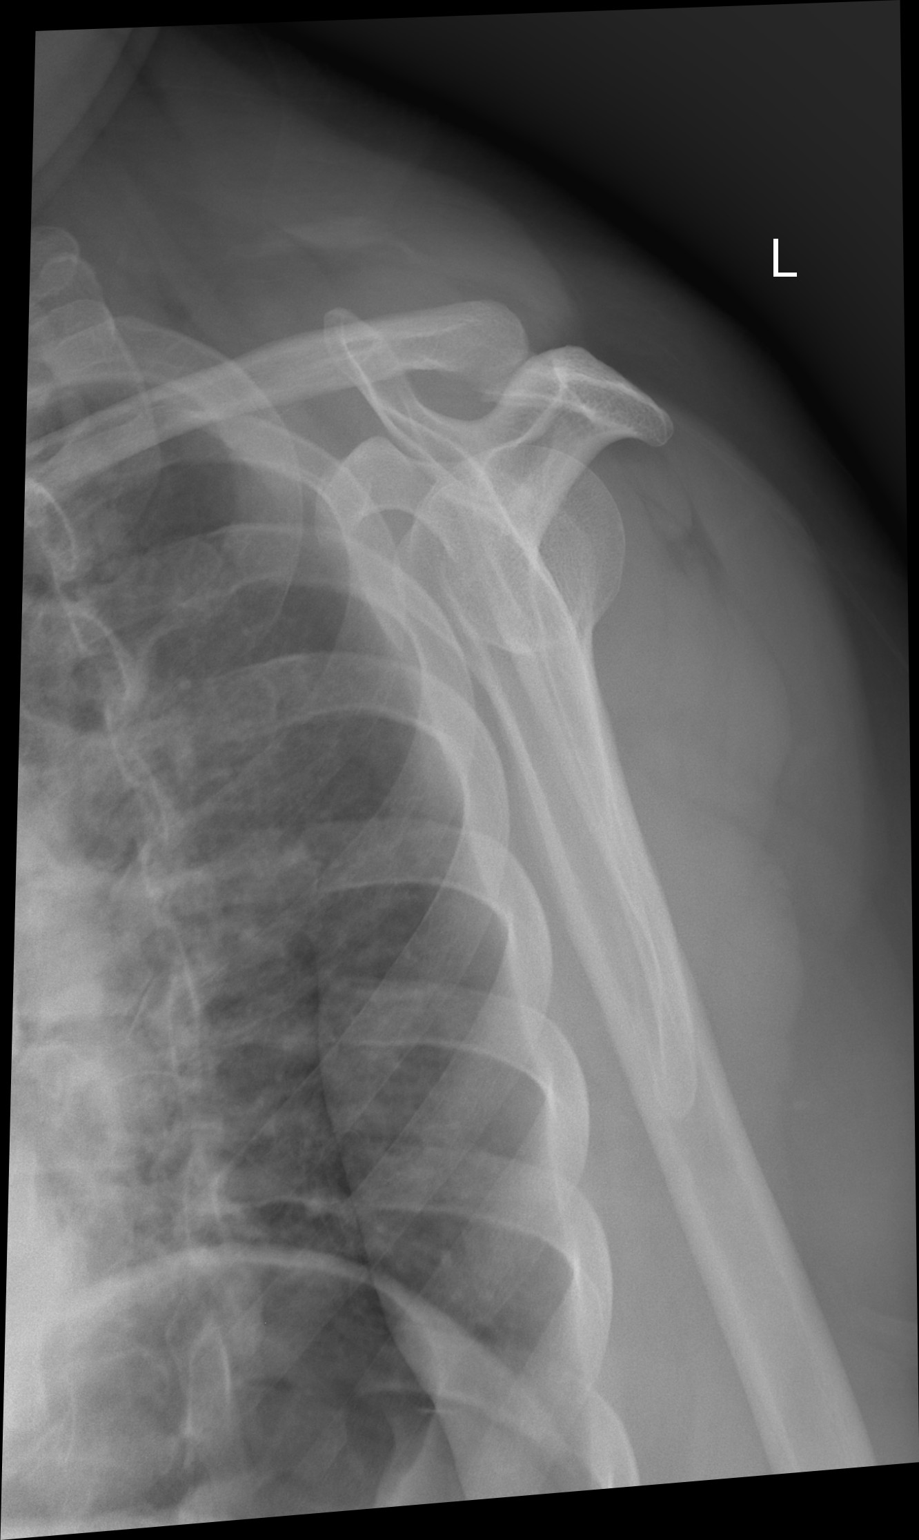

[3 of 3 positions shown; findings below may reference images not displayed]

FINDINGS: BONES/JOINTS:  No definite acute fracture.

SOFT TISSUES:  NO radiopaque foreign body.
IMPRESSION: - NO acute fractures.

Tech Notes:

injury to left shoulder, c/o pain and Ltd ROM. denies preg. AK

## 2021-07-02 IMAGING — MG MM mammogram 3D screen bilat
8 series · 9 of 24 positions shown · non-contrast
Comparison: none

[R CC]
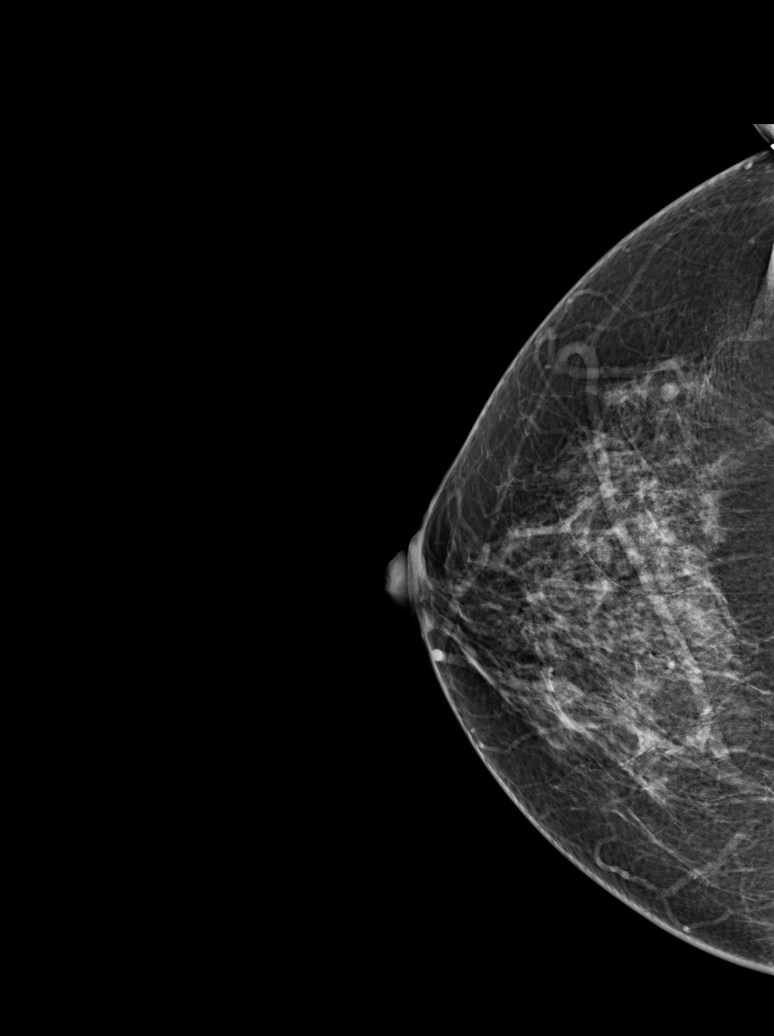

[Series 4: BTO_TOMO R-CC PRIME, EMPIRE_C.97/112, Sc tomo · 2 of 65 frames shown]
[frame 21/65]
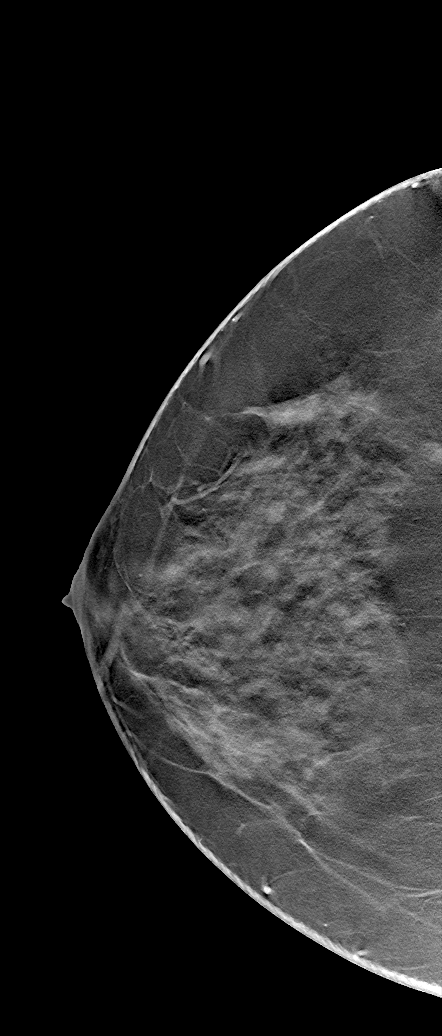
[frame 33/65]
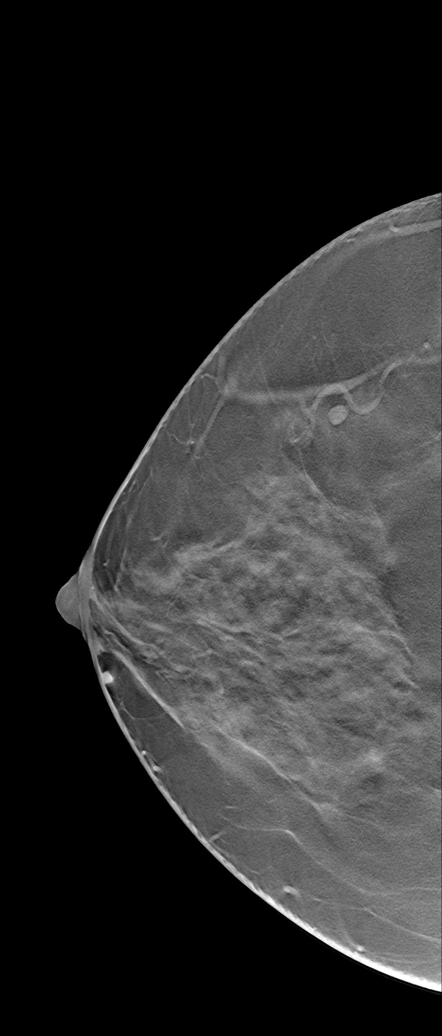

[L CC]
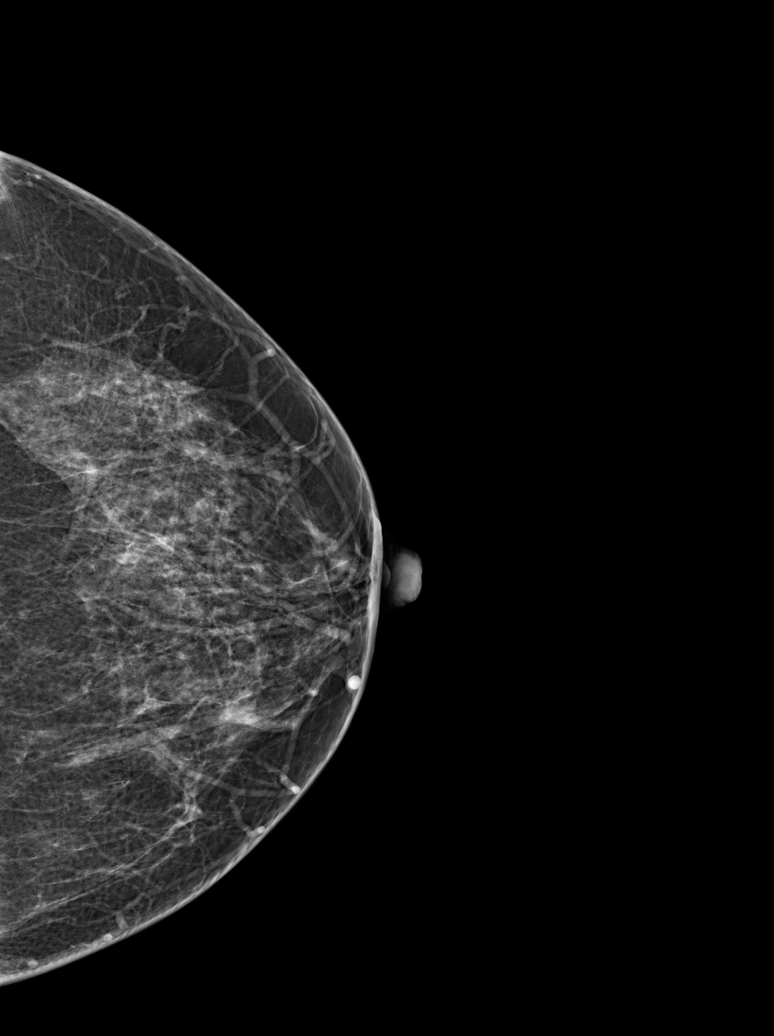

[BTO_TOMO L-CC PRIME, EMPIRE_C.97/112, Sc tomo · tomo slice 27/52.0]
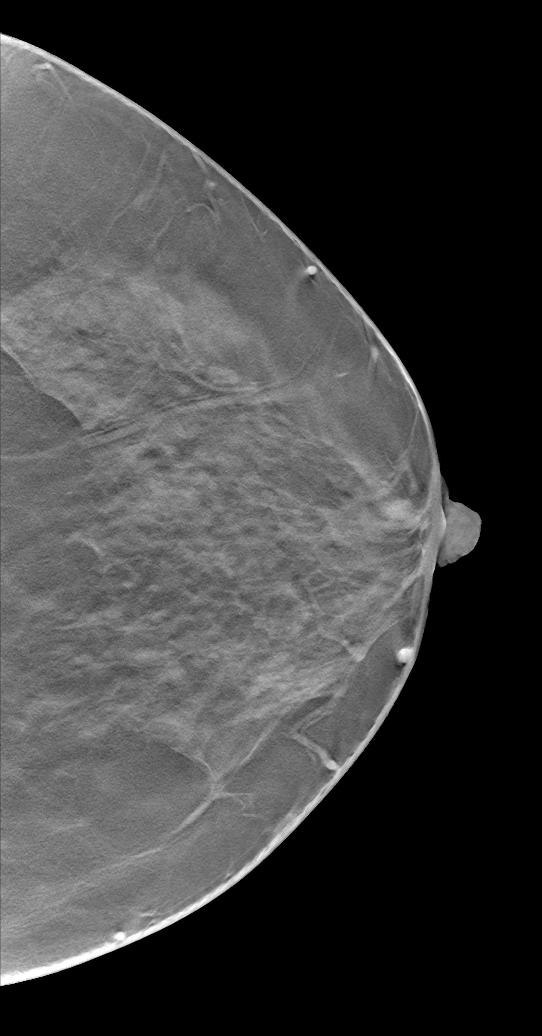

[L MLO]
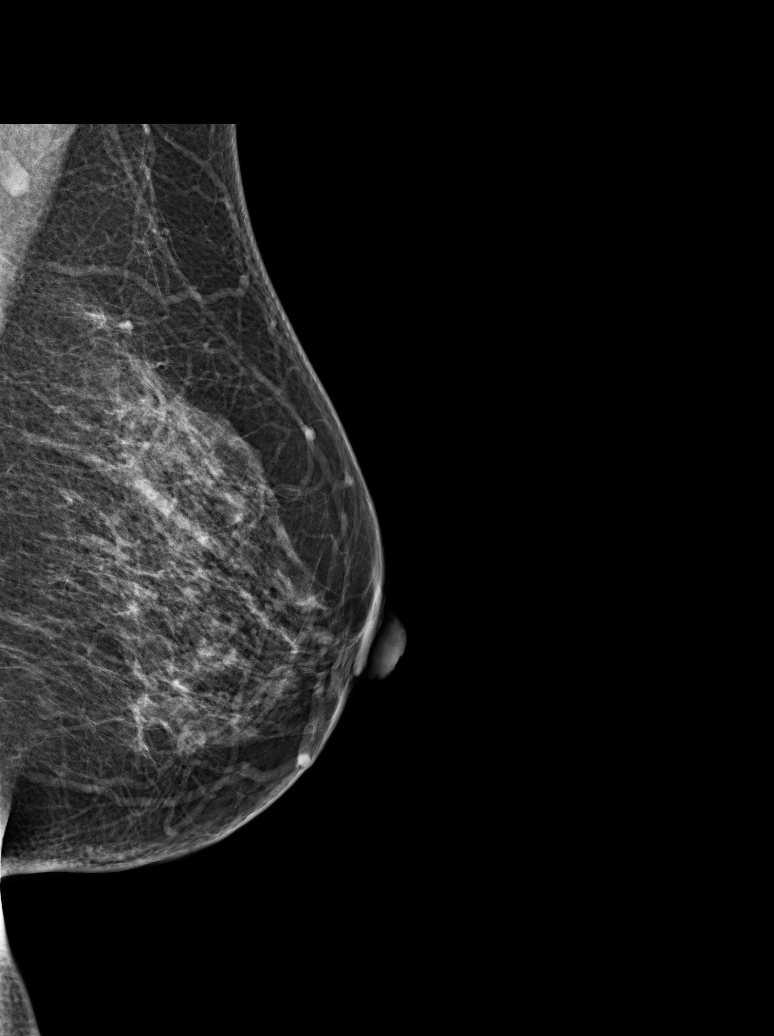

[BTO_TOMO L-MLO PRIME, EMPIRE_C.97/112, S tomo · tomo slice 32/63.0]
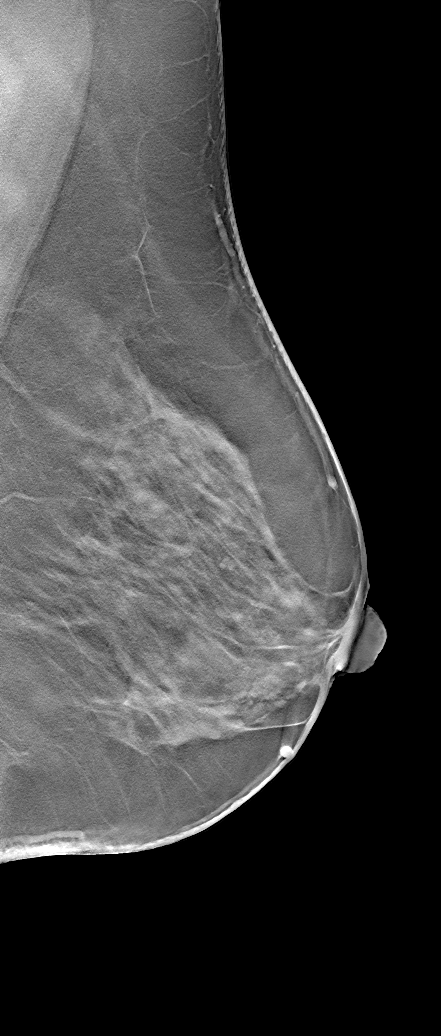

[R MLO]
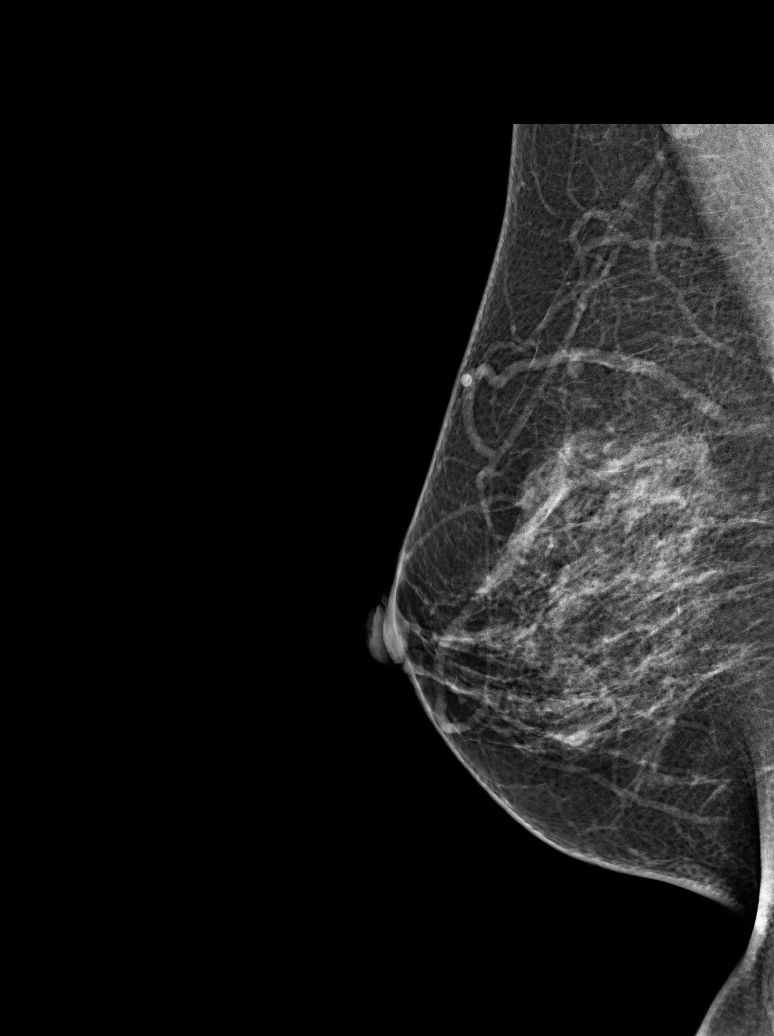

[BTO_TOMO R-MLO PRIME, EMPIRE_C.97/112, S tomo · tomo slice 37/73.0]
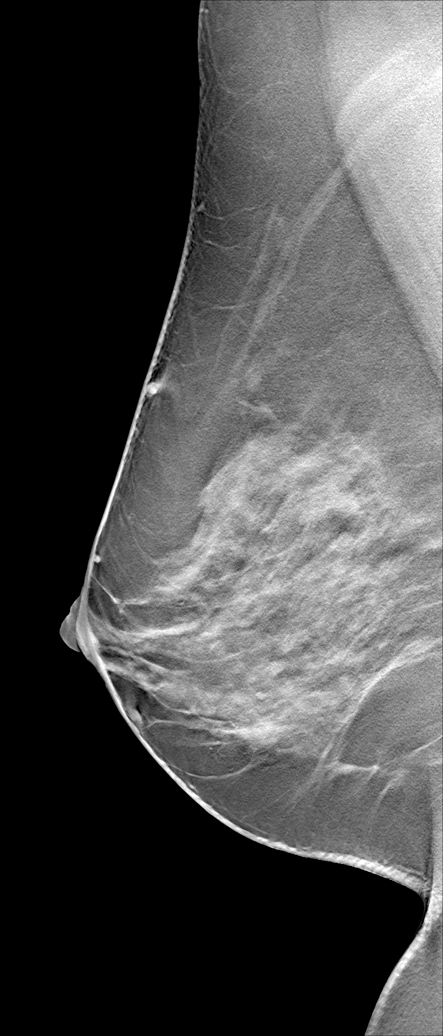

[9 of 24 positions shown; findings below may reference images not displayed]

EXAM

3D SCREENING MAMMOGRAM, BILATERAL

INDICATION

Screening
screening. KF 3D. priors 2027 here  lifetime TC score: 7.2%

TECHNIQUE

Digital 2D CC and MLO projections obtained with 3D tomographic views per manufacturer's protocol.

COMPARISONS

November 02, 2019

FINDINGS

ACR Type 2:  25-50% There are scattered fibroglandular densities.

Small unchanged lateral right breast density is noted. No concerning masses or calcifications are
seen.

IMPRESSION

Stable mammograms. One year follow-up is recommended.

BI-RADS 2, BENIGN.

Tech Notes:

## 2021-08-11 IMAGING — CR [ID]
3 series · 3 of 3 positions shown · non-contrast
Comparison: none

[foot ap]
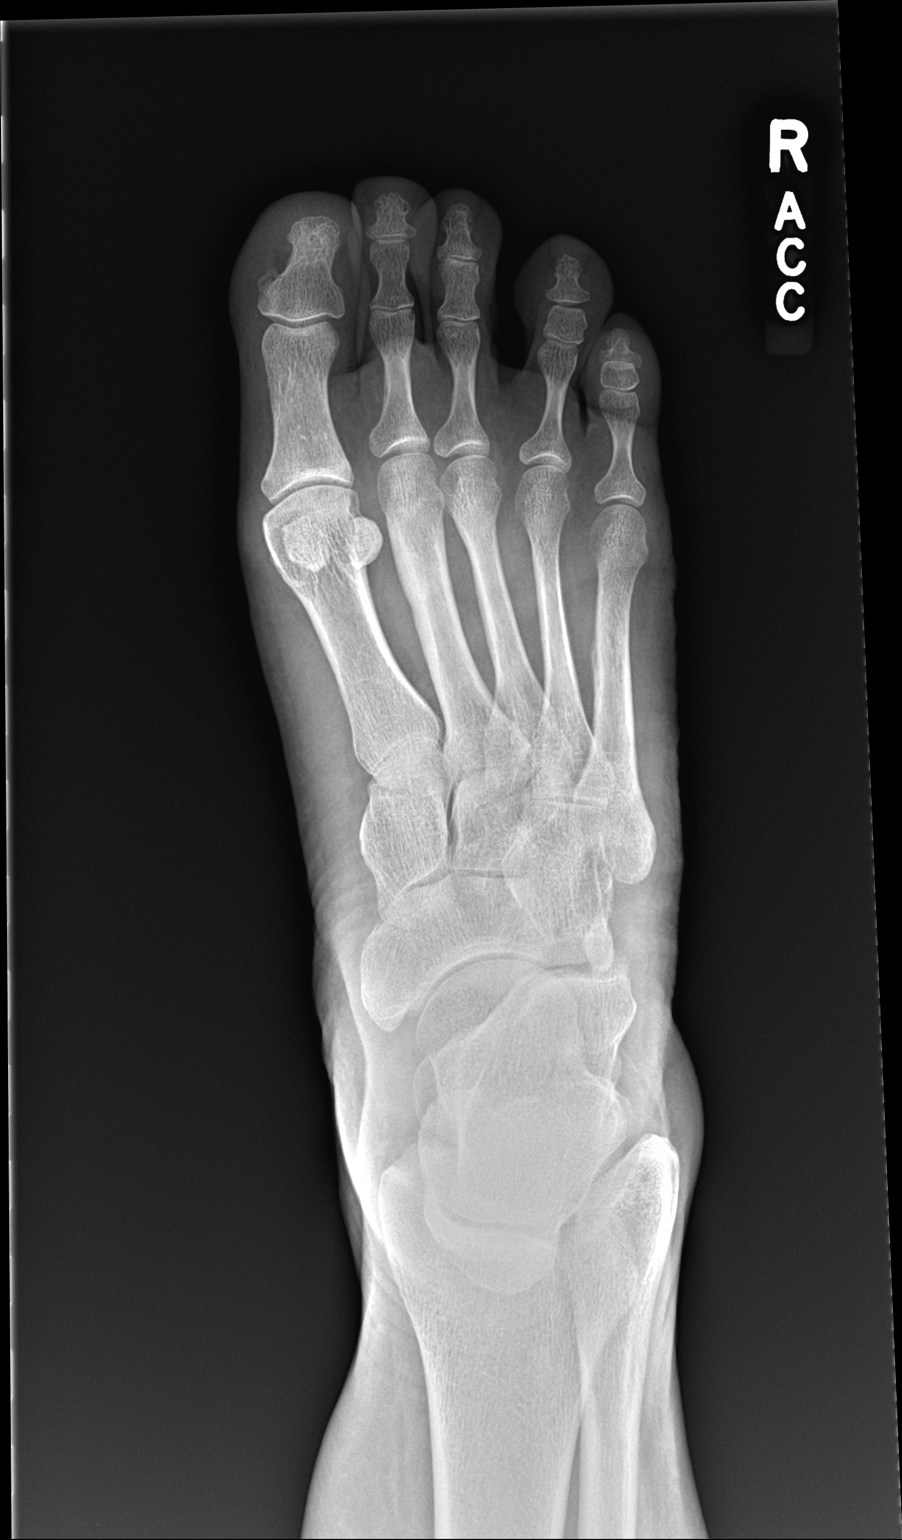

[foot obl]
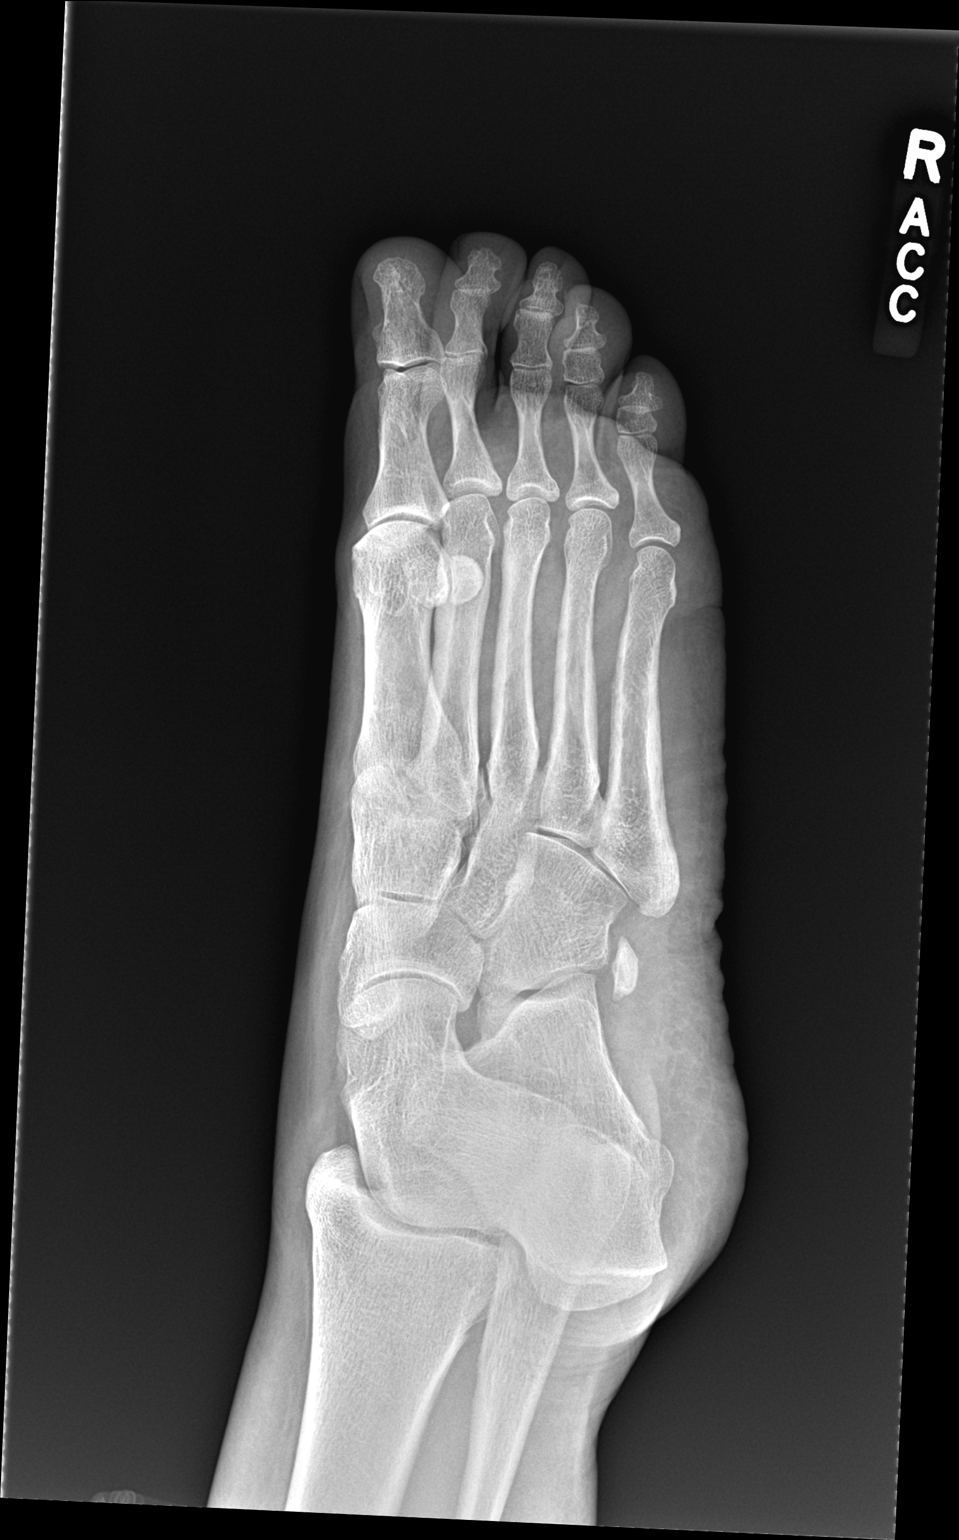

[foot lat]
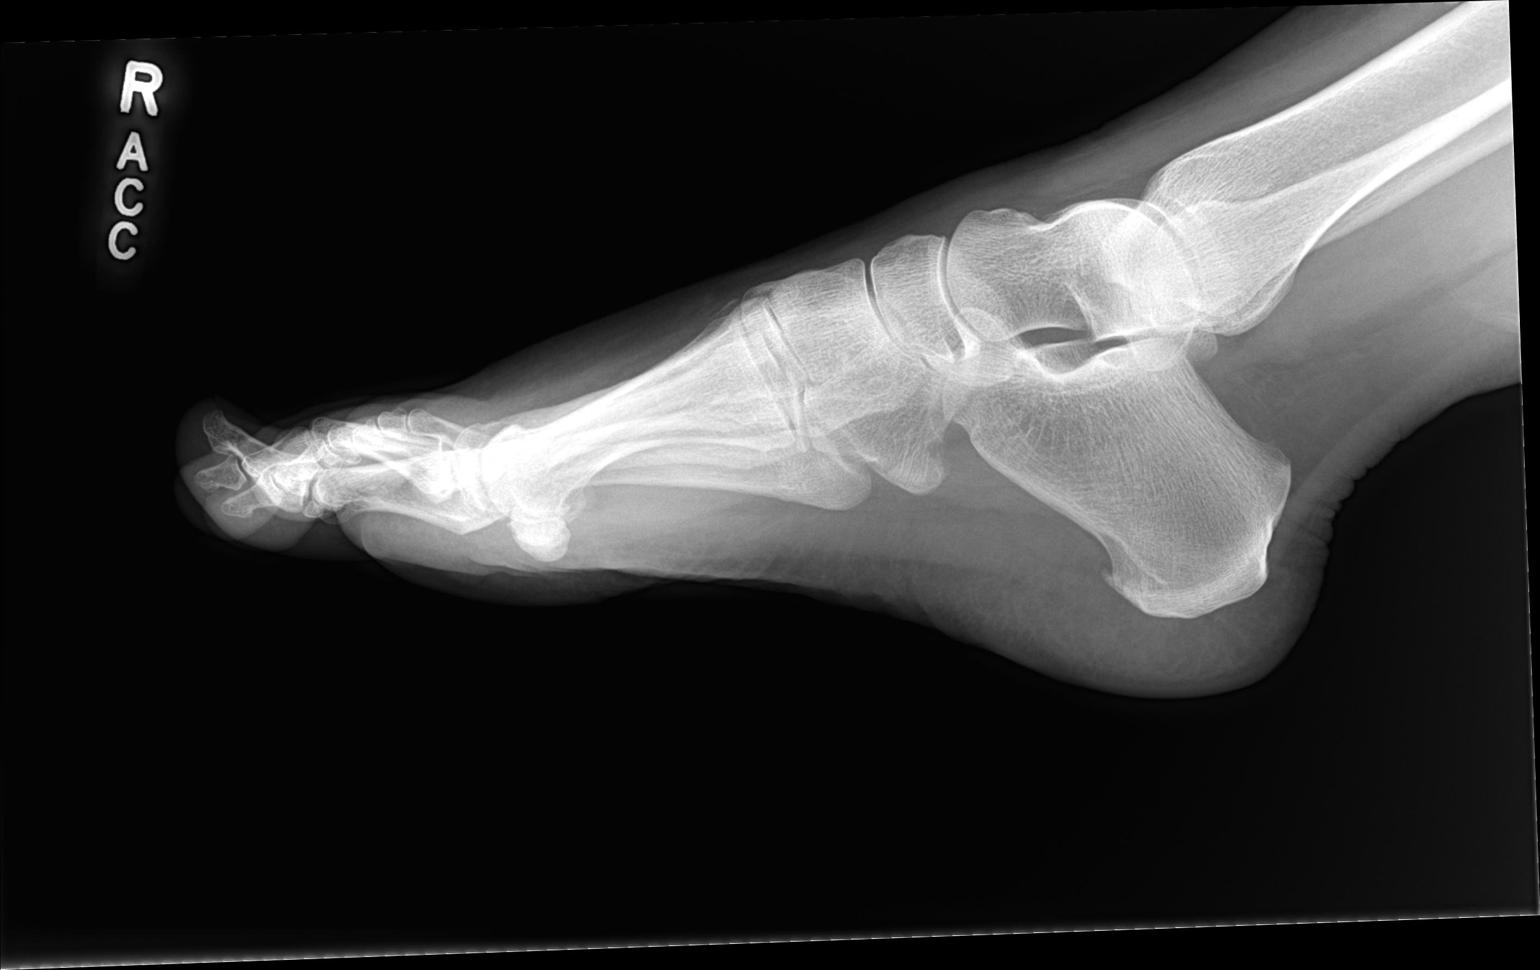

[3 of 3 positions shown; findings below may reference images not displayed]

DIAGNOSTIC STUDIES

EXAM

XR foot RT min 3V

INDICATION

right foot pain
Right foot pain x 1 week, patient states she kicked a floor saw.

TECHNIQUE

AP lateral and oblique views

COMPARISONS

November 07, 2020

FINDINGS

Old 2nd metatarsal fracture is evident. No acute fractures are seen.

IMPRESSION

Old 2nd metatarsal fracture. No acute fractures are seen.

Tech Notes:

Right foot pain x 1 week, patient states she kicked a floor saw.

## 2021-09-08 IMAGING — CR [ID]
3 series · 3 of 3 positions shown · non-contrast
Comparison: none

[foot ap]
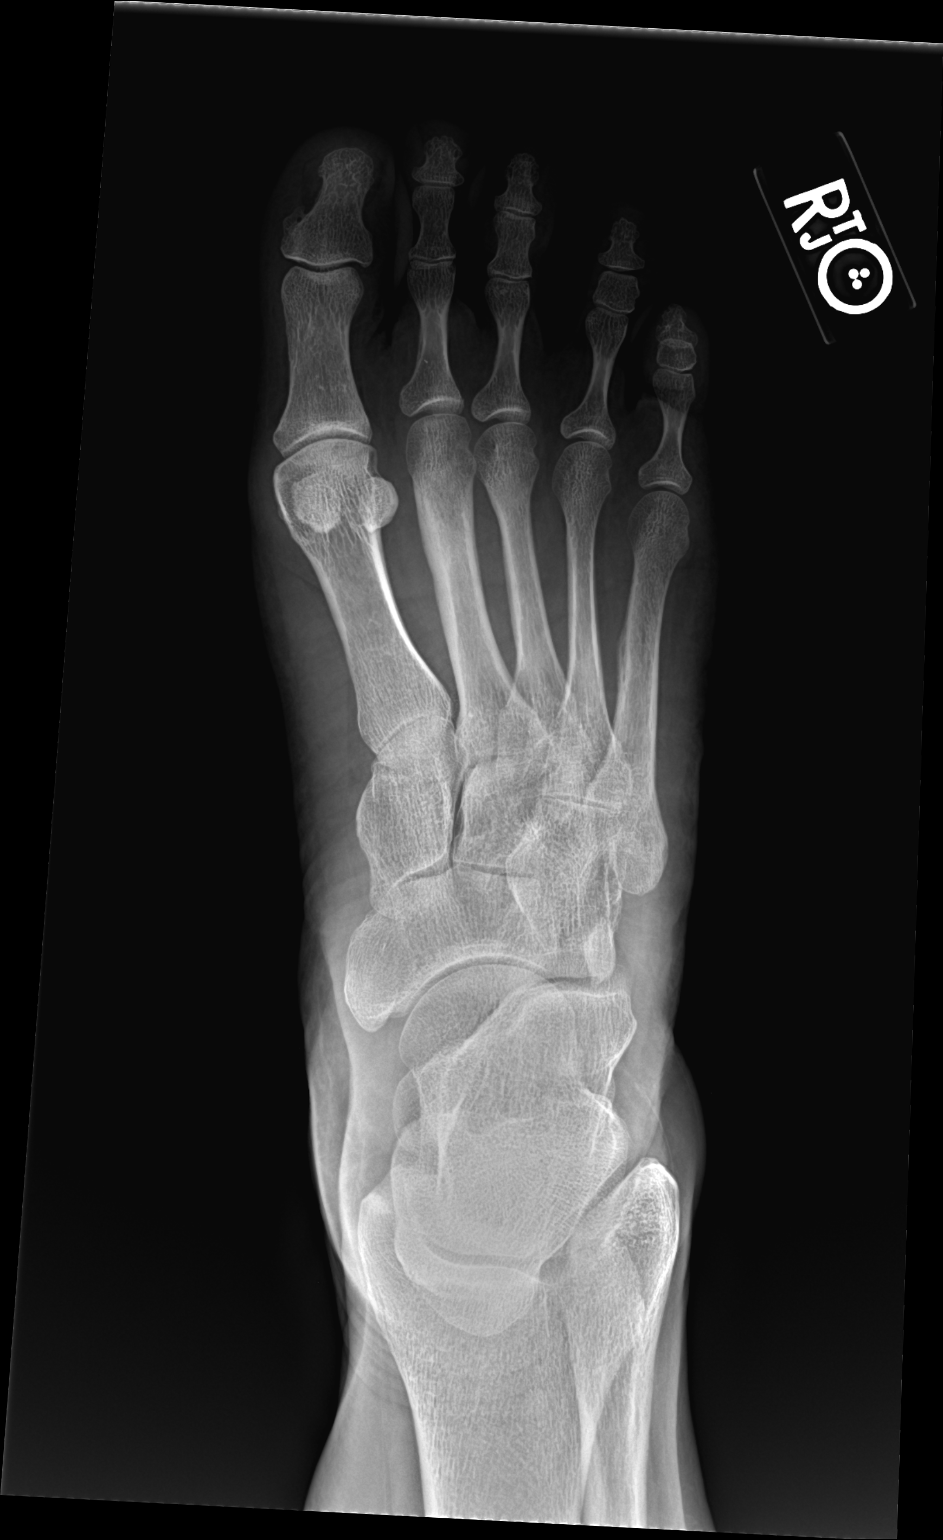

[foot obl]
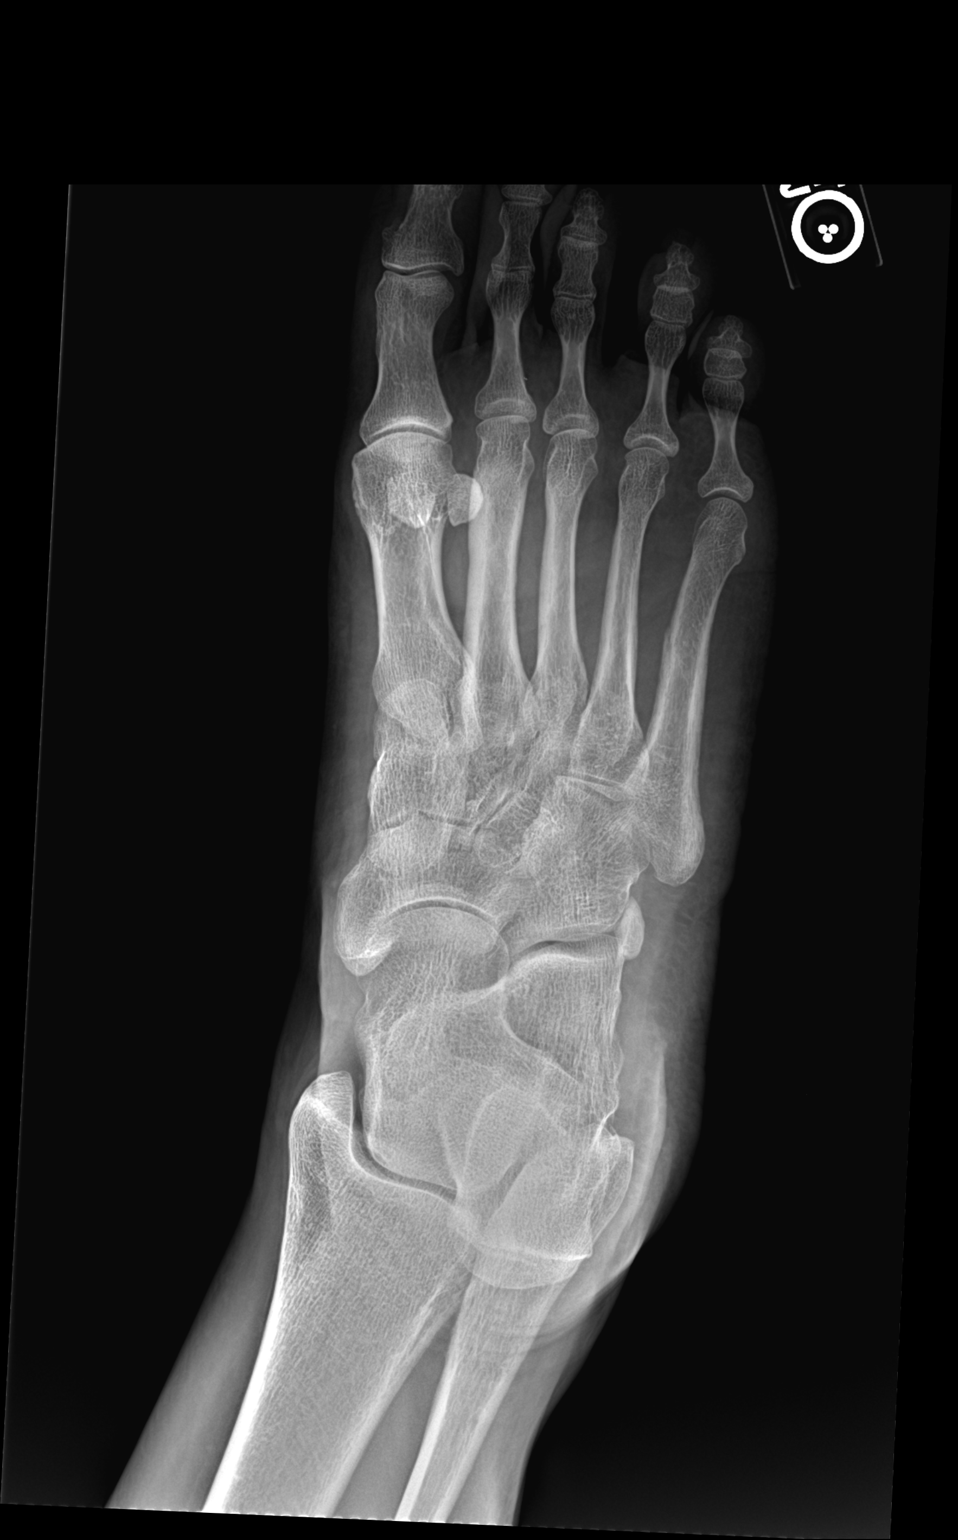

[foot lat]
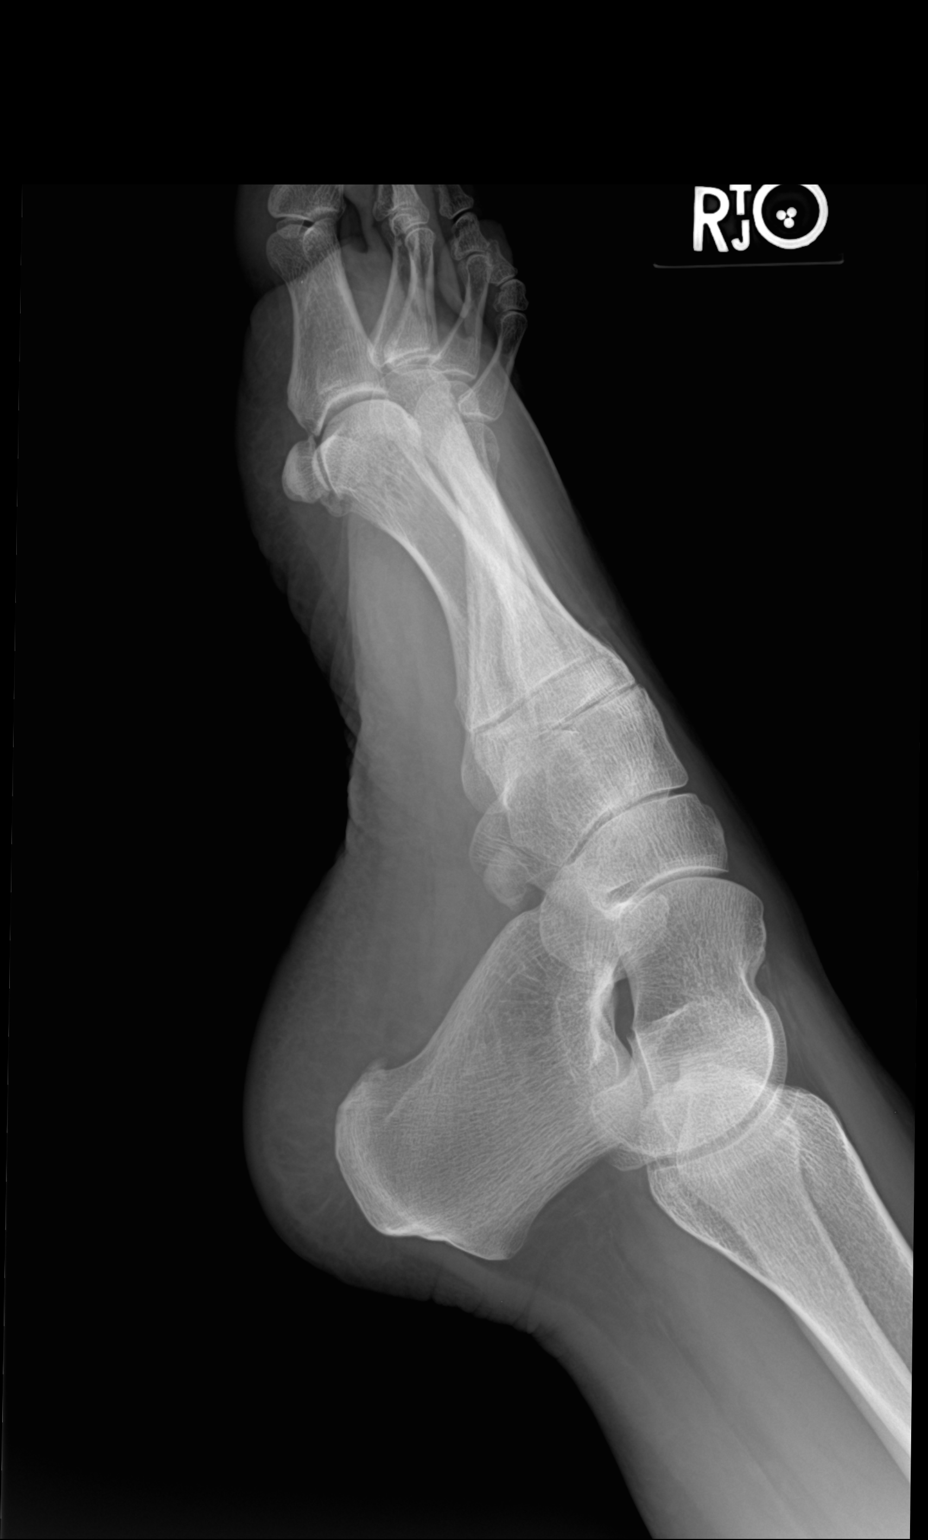

[3 of 3 positions shown; findings below may reference images not displayed]

DIAGNOSTIC STUDIES

EXAM

XR foot RT min 3V

INDICATION

Rt foot Metatsal stress fracture
F/U STRESS FX

TECHNIQUE

AP lateral and oblique views

COMPARISONS

August 18, 2021

FINDINGS

Prior 2nd metatarsal fracture is re-demonstrated. Possible subtle cortical lucency is seen along the
shaft of the 5th meta tarsal however this is unchanged probably is old as well. Correlation with p
hysical exam is recommended.

IMPRESSION

Healed 2nd metatarsal fracture. Tiny lucency along the medial cortex of the midshaft of the 5th
metatarsal likely is old also. Correlation with physical exam is recommended.

Tech Notes:

F/U STRESS FX

## 2021-09-22 IMAGING — CR [ID]
2 series · 2 of 2 positions shown · non-contrast
Comparison: none

[x foot ap left]
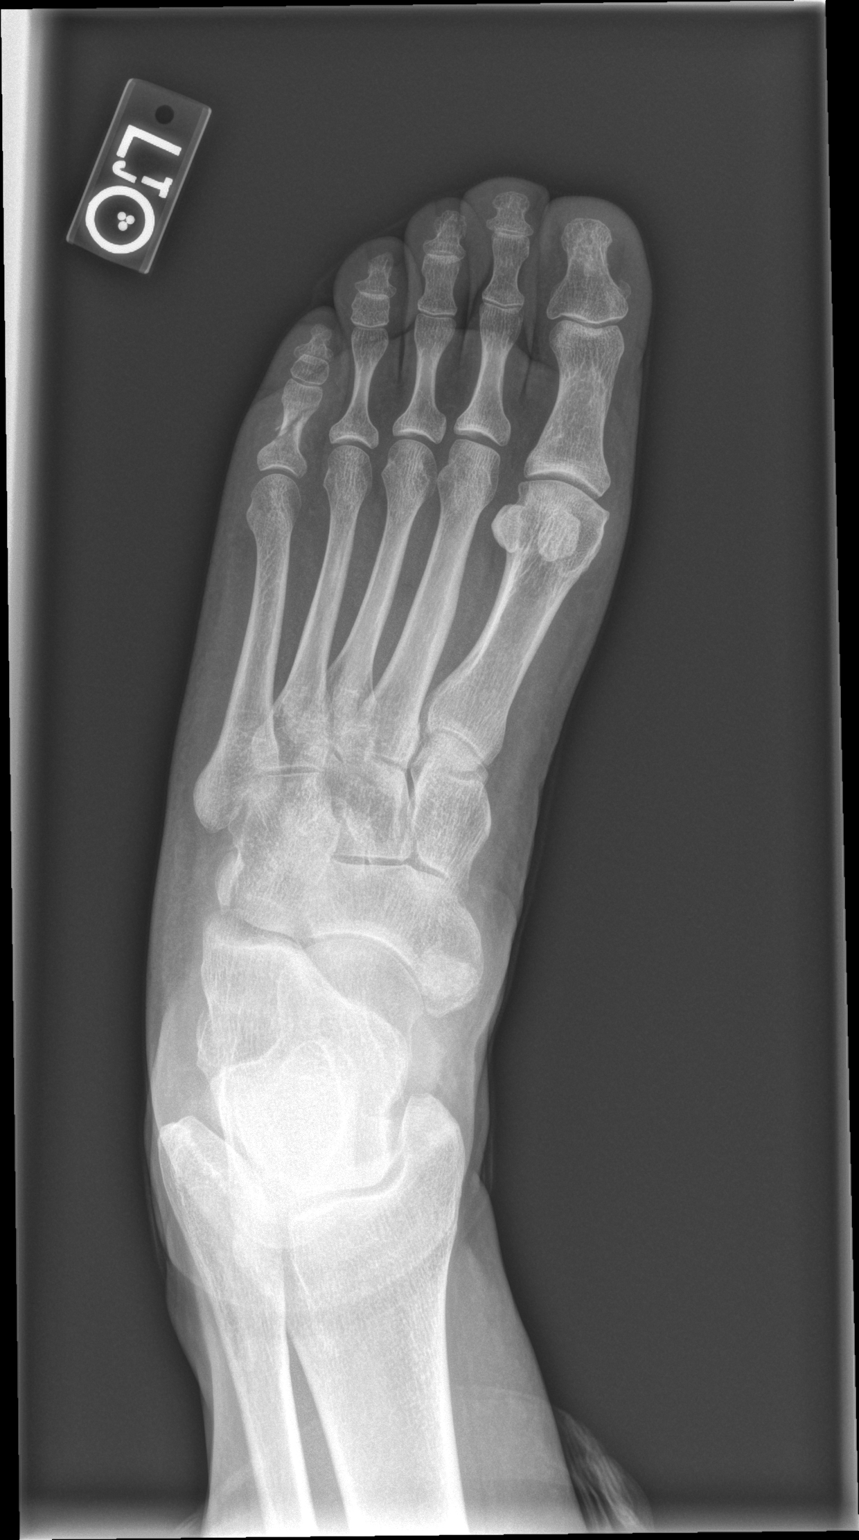

[x foot lat left]
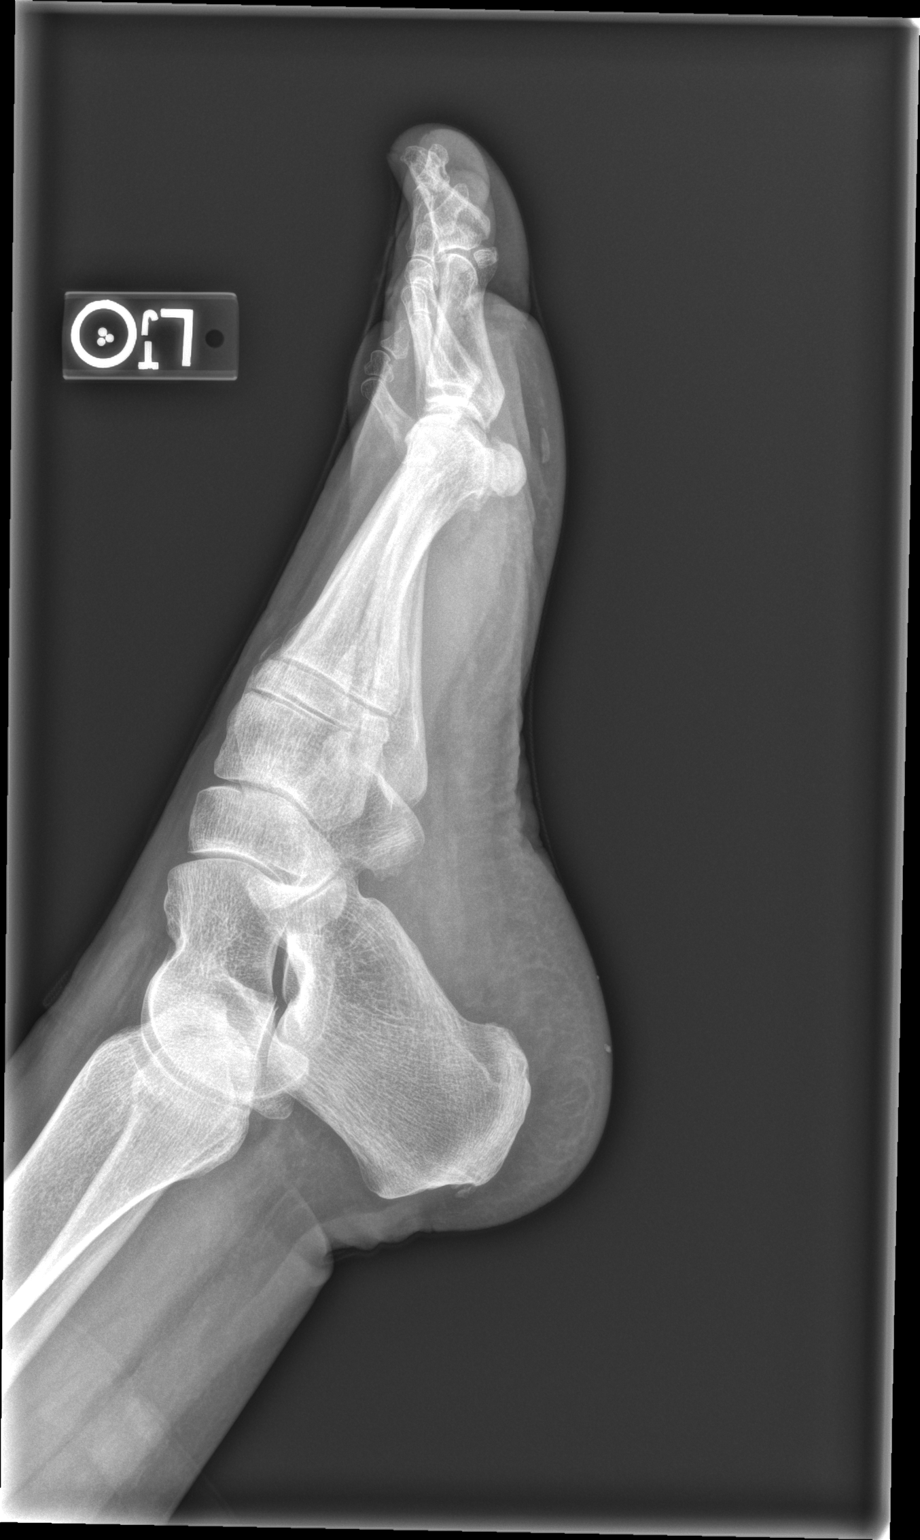

[2 of 2 positions shown; findings below may reference images not displayed]

DIAGNOSTIC STUDIES

EXAM

XR foot LT 2V

INDICATION

left 5th phalanx fx
PT STATES F/U TOE FX. TJ

TECHNIQUE

AP lateral views

COMPARISONS

August 18, 2021

FINDINGS

Nondisplaced fracture through the midshaft proximal phalanx 5th digit is re-demonstrated with
minimal callus formation.

IMPRESSION

Healing fracture of the proximal phalanx 5th digit.

Tech Notes:

PT STATES F/U TOE FX. TJ

## 2021-09-23 IMAGING — MR FOOTRTWO
5 of 7 series · 36 of 40 positions shown · non-contrast
Comparison: none

[Series 3: PD fat-sat · coronal · right · 3.5mm · 0.44mm/px · 9 of 42 slices shown (1 of 2)]
[im 1/42]
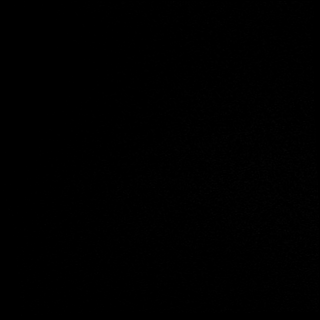
[im 6/42]
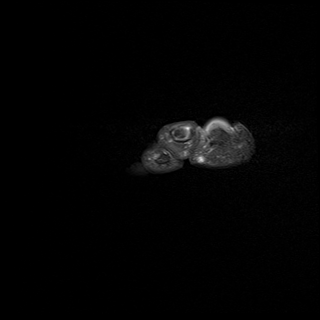
[im 11/42]
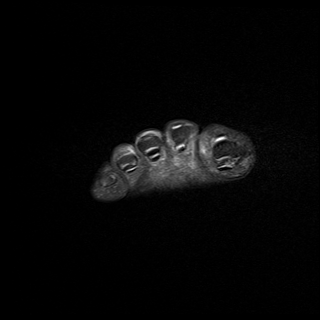
[im 16/42]
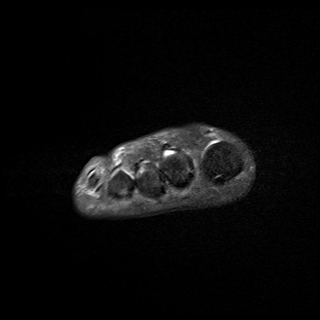
[im 21/42]
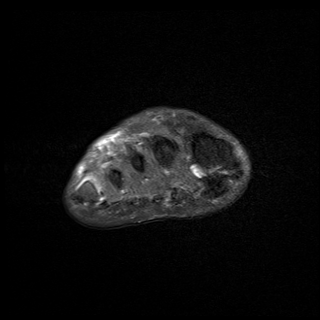
[im 26/42]
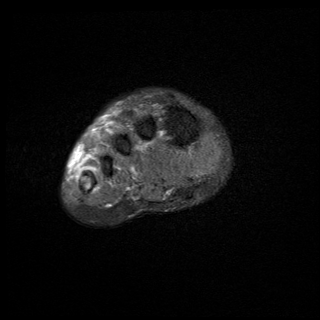
[im 31/42]
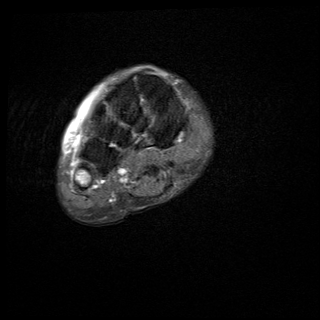
[im 36/42]
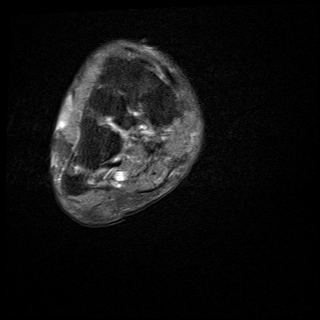
[im 42/42]
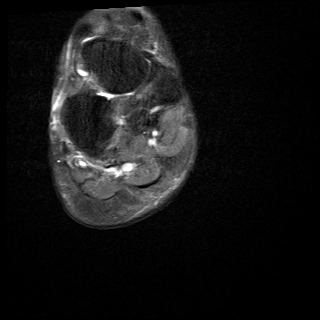

[Series 4: T1 · axial · right · 3.0mm · 0.45mm/px · z∈[-80,+2]mm · 6 of 27 slices shown (1 of 2)]
[im 1/27]
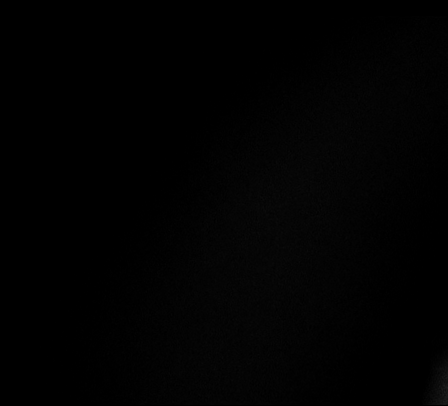
[im 6/27]
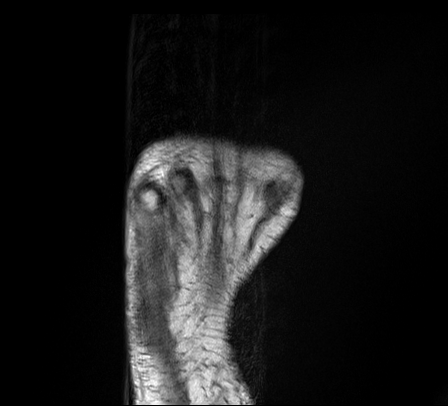
[im 11/27]
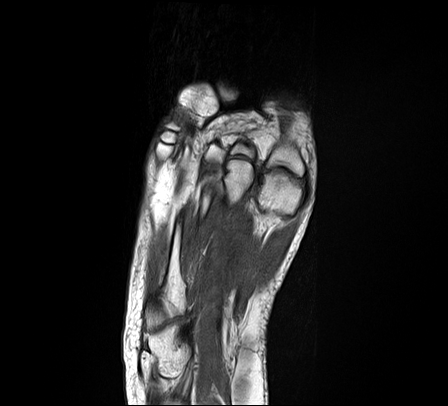
[im 16/27]
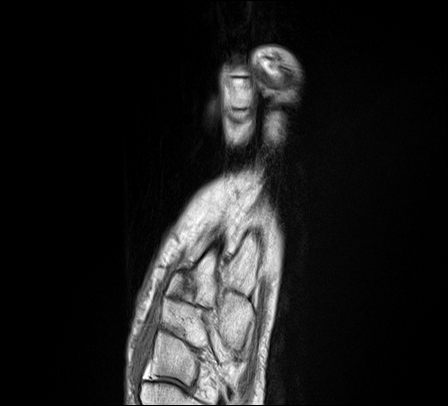
[im 21/27]
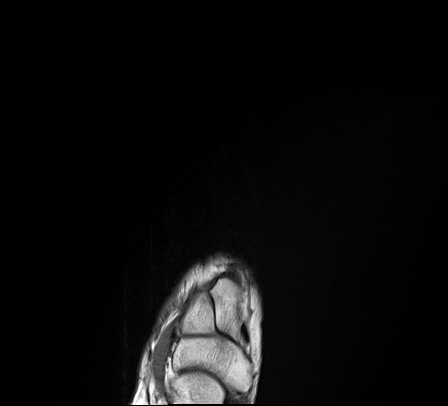
[im 27/27]
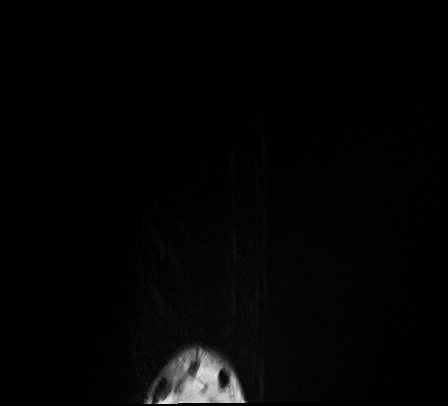

[Series 6: PD fat-sat · axial · right · 3.0mm · 0.62mm/px · z∈[-84,+5]mm · 7 of 32 slices shown (2 of 2)]
[im 1/32]
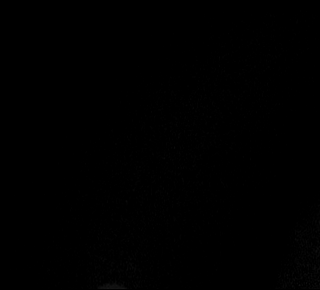
[im 6/32]
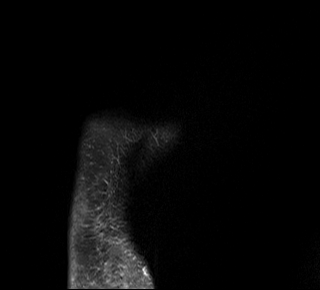
[im 11/32]
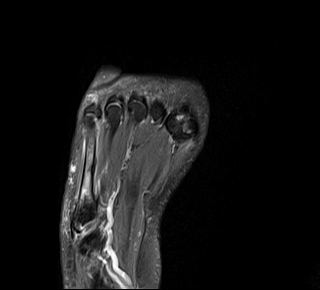
[im 16/32]
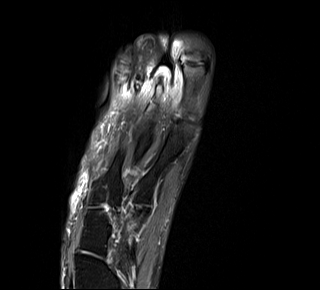
[im 21/32]
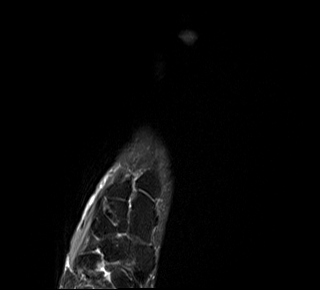
[im 26/32]
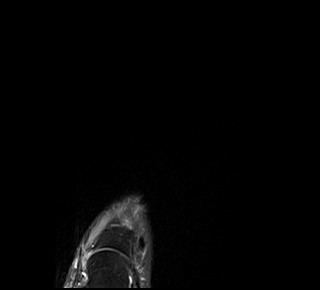
[im 32/32]
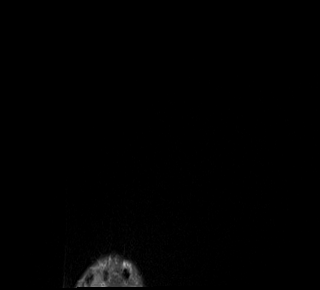

[Series 7: STIR · sagittal · right · 3.0mm · 0.62mm/px · 8 of 35 slices shown]
[im 1/35]
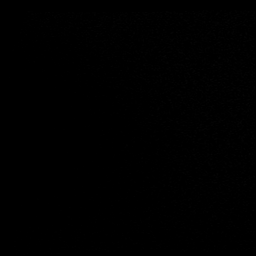
[im 5/35]
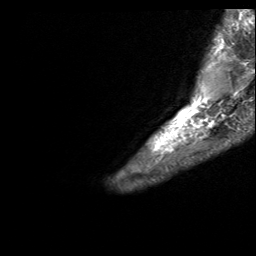
[im 10/35]
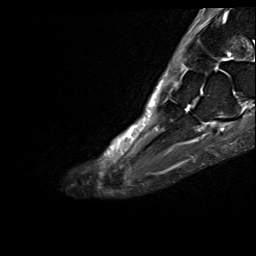
[im 15/35]
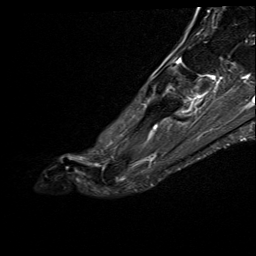
[im 20/35]
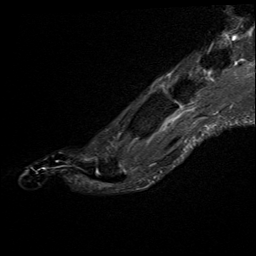
[im 25/35]
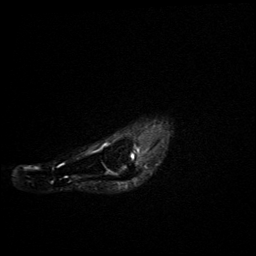
[im 30/35]
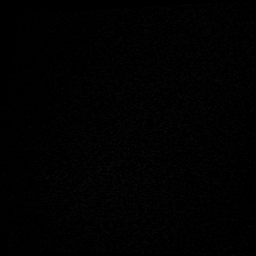
[im 35/35]
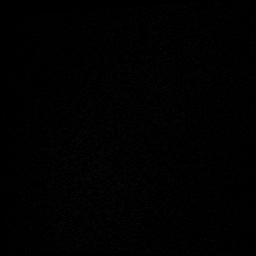

[Series 10: T1 · sagittal · right · 3.5mm · 0.42mm/px · 6 of 28 slices shown (2 of 2)]
[im 1/28]
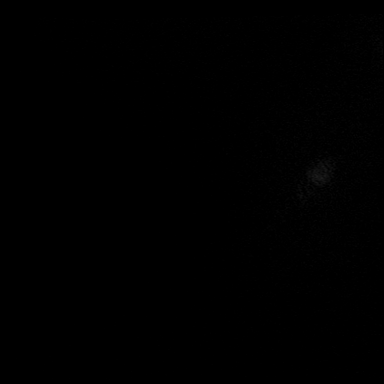
[im 6/28]
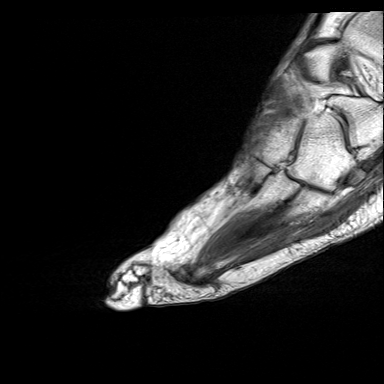
[im 11/28]
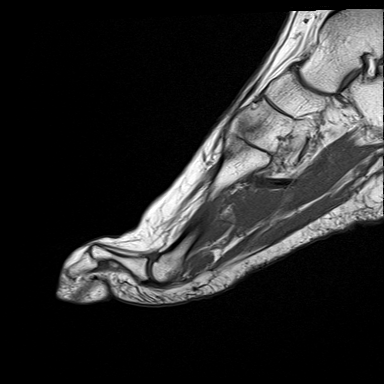
[im 17/28]
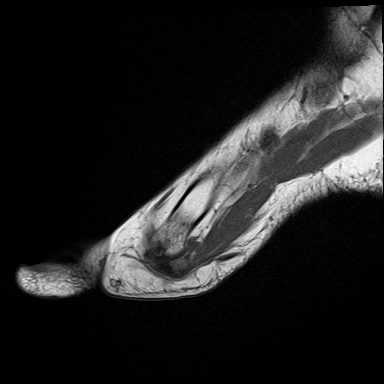
[im 22/28]
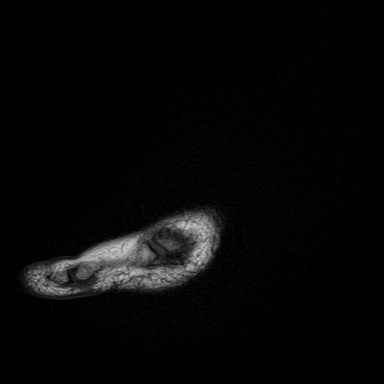
[im 28/28]
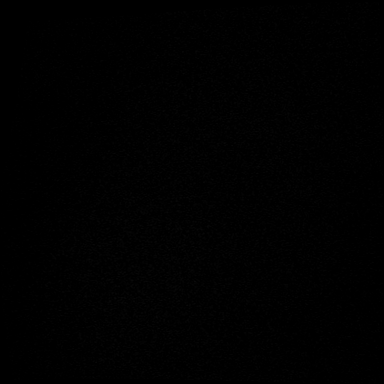

[36 of 40 positions shown; findings below may reference images not displayed]

DIAGNOSTIC STUDIES

EXAM

MRI of the right foot without contrast.

INDICATION

metatarsal stress fracture of right foot
RT LATERAL FOOT PAIN FOR SEVERAL YEARS.  NO KNOWN INJURY.  PT WEARING BOOT.  RG

TECHNIQUE

Sagittal axial and coronal images were obtained with variable T1 and T2 weighting.

COMPARISONS

None available

FINDINGS

Exam is compromised due to significant patient motion.

There is diffuse marrow edema involving the 5th metatarsal shaft image 22 series 6 consistent with
stress fracture.

No additional areas of marrow edema are seen throughout the visualized osseous structures. The
visualized aspects of the flexor extensor tendons are within normal limits. Soft tissue edema is
seen adjacent to the 5th metatarsal shaft.

IMPRESSION

Marrow edema involving the 5th metatarsal shaft consistent with nondisplaced stress type fracture.
Adjacent soft tissue edema is seen.

Tech Notes:

RT LATERAL FOOT PAIN FOR SEVERAL YEARS.  NO KNOWN INJURY.  PT WEARING BOOT.  RG

## 2021-09-23 IMAGING — CR [ID]
3 series · 3 of 3 positions shown · non-contrast
Comparison: none

[tspine ap]
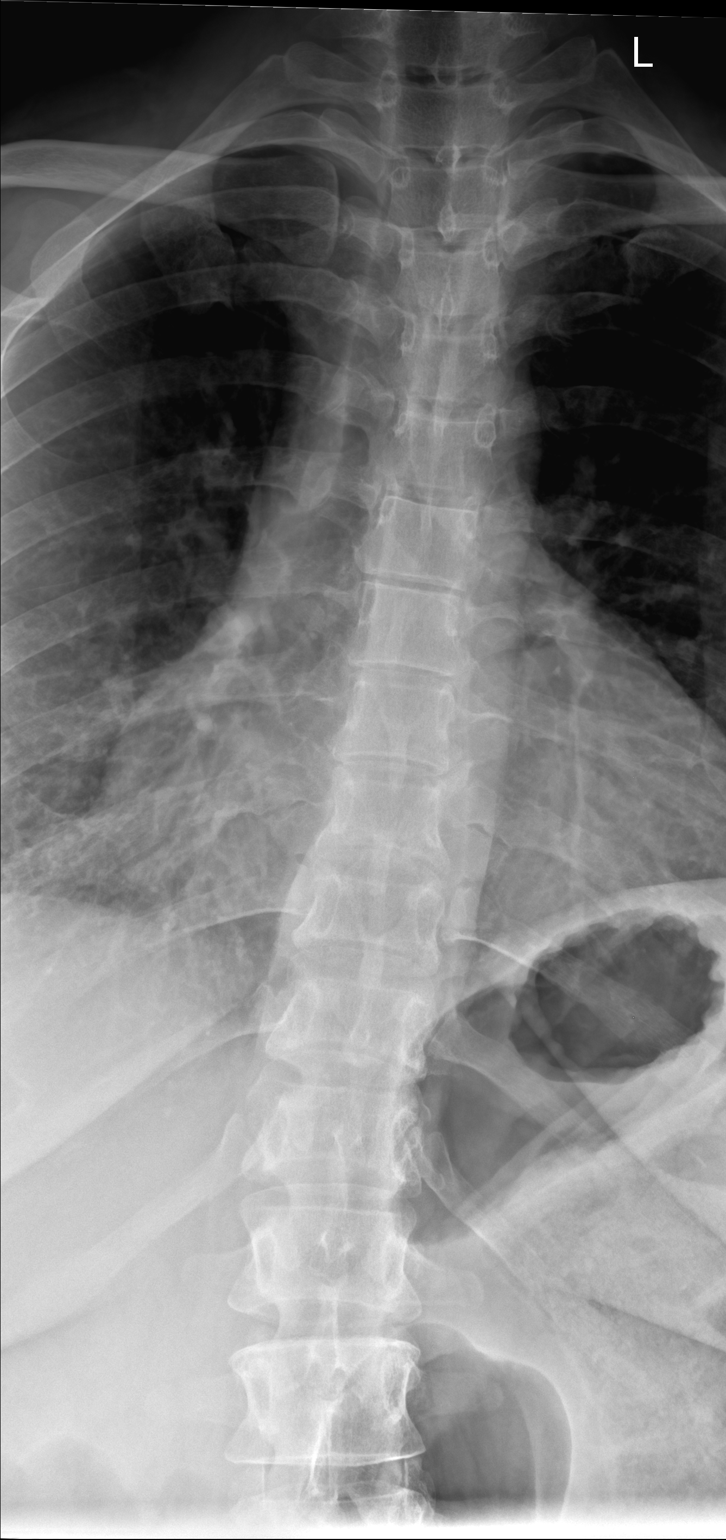

[tspine swimmers]
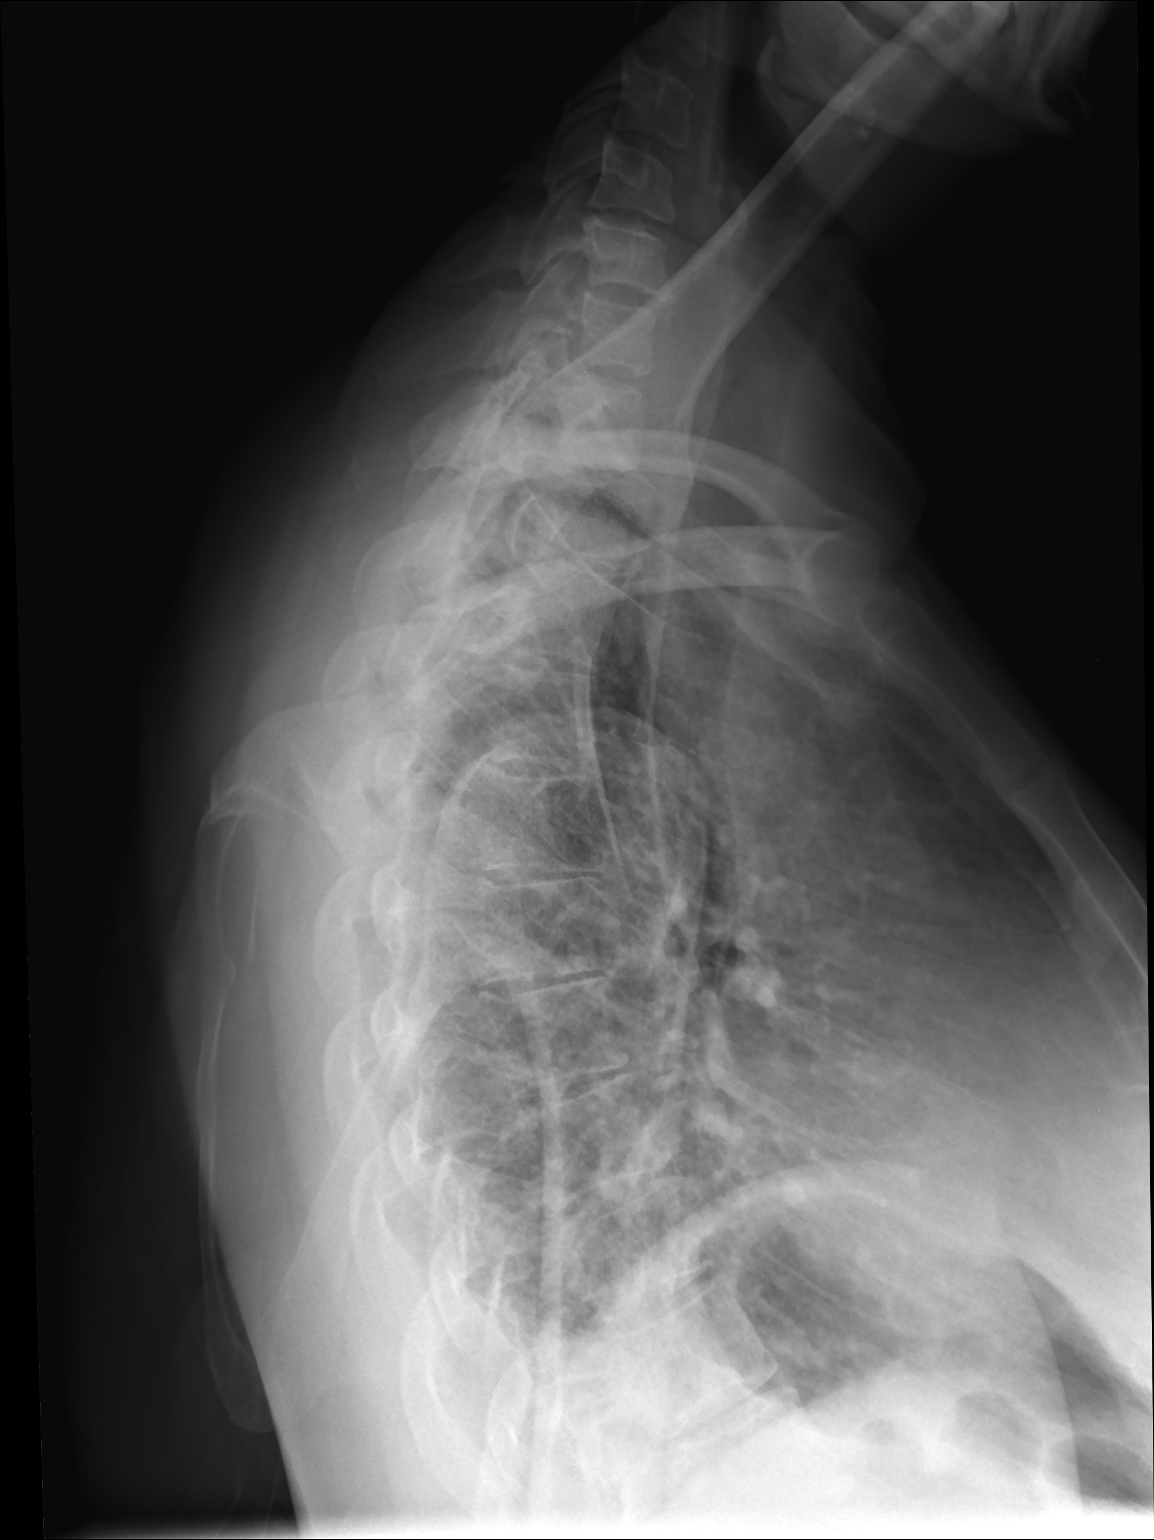

[tspine lat_breathing]
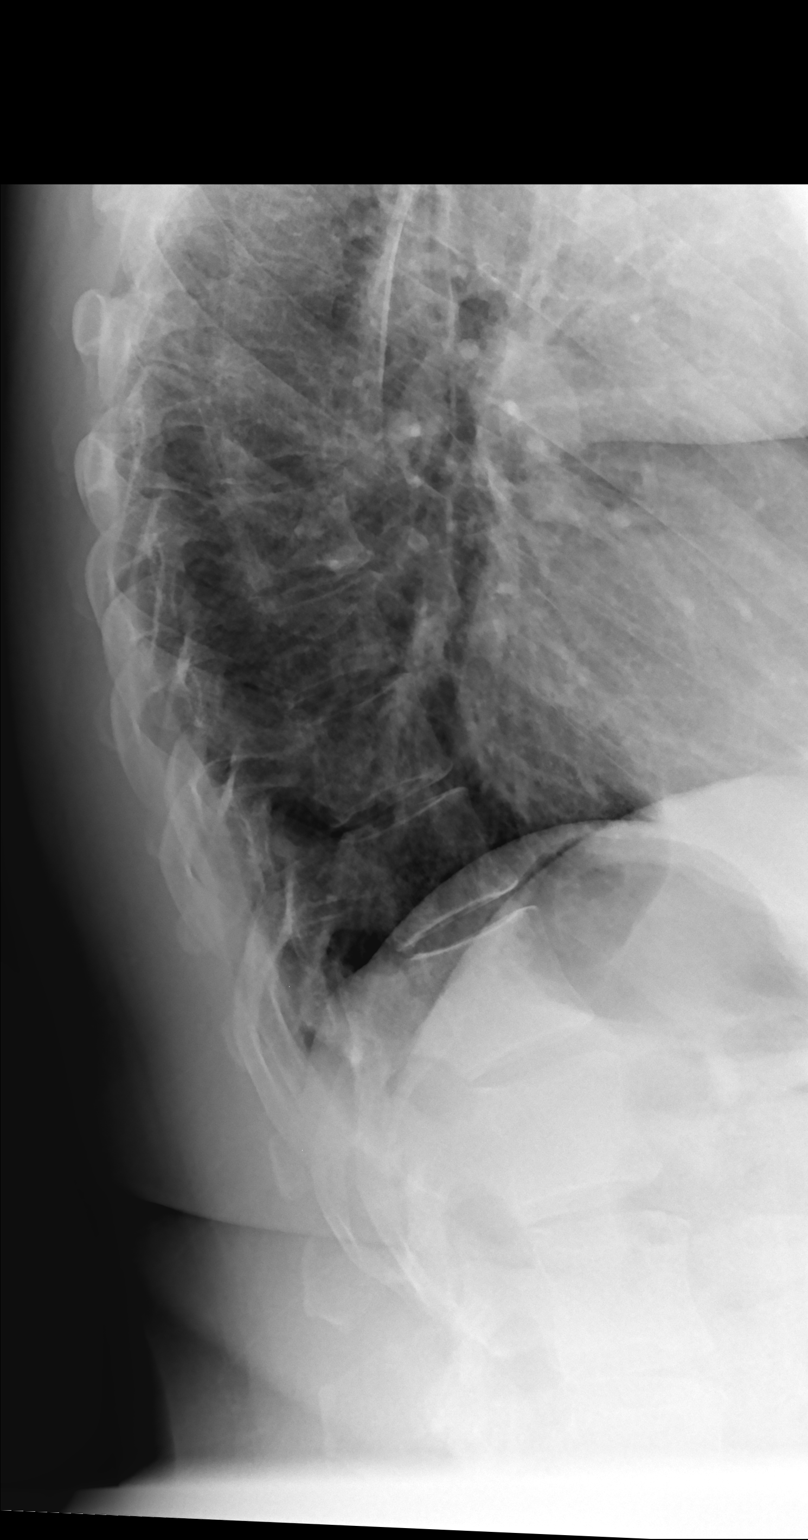

[3 of 3 positions shown; findings below may reference images not displayed]

DIAGNOSTIC STUDIES

EXAM

XR thoracic spine 3V

INDICATION

chronic back pain
middle back pain, denies trauma

TECHNIQUE

AP lateral and spot films

COMPARISONS

None available

FINDINGS

Slight curvature of the thoracic spine to the left is seen. No compression fractures are noted.
There is mild osteophytic spurring of the mid thoracic spine.

IMPRESSION

Minor degenerative changes as described. No compression fractures are seen.

Tech Notes:

middle back pain, denies trauma

## 2021-09-23 IMAGING — CR [ID]
5 series · 5 of 5 positions shown · non-contrast
Comparison: none

[lspine ap]
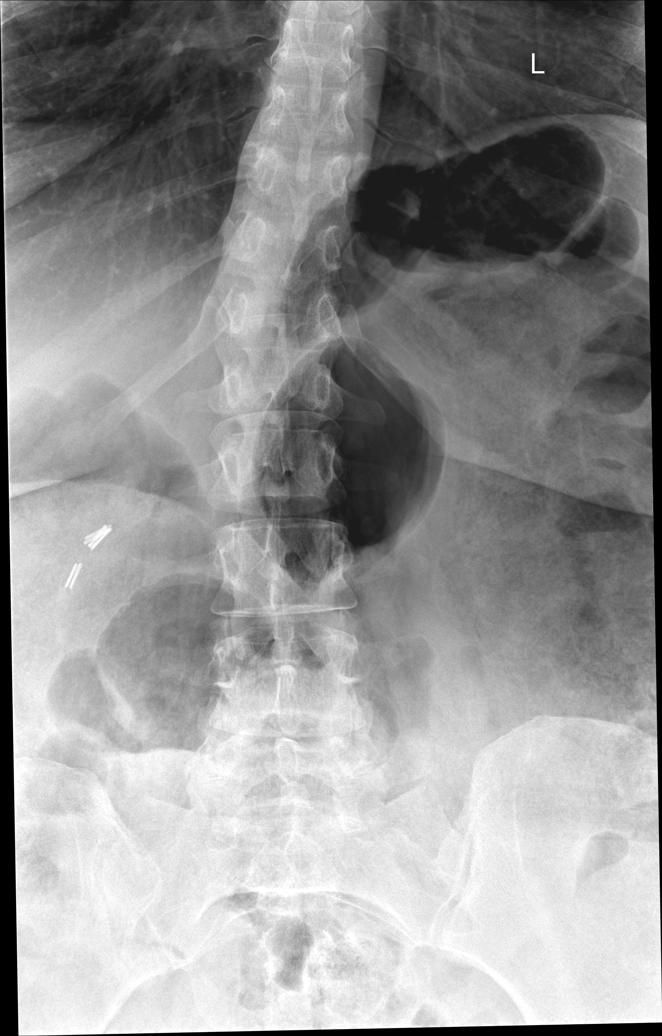

[lspine obl (1 of 2)]
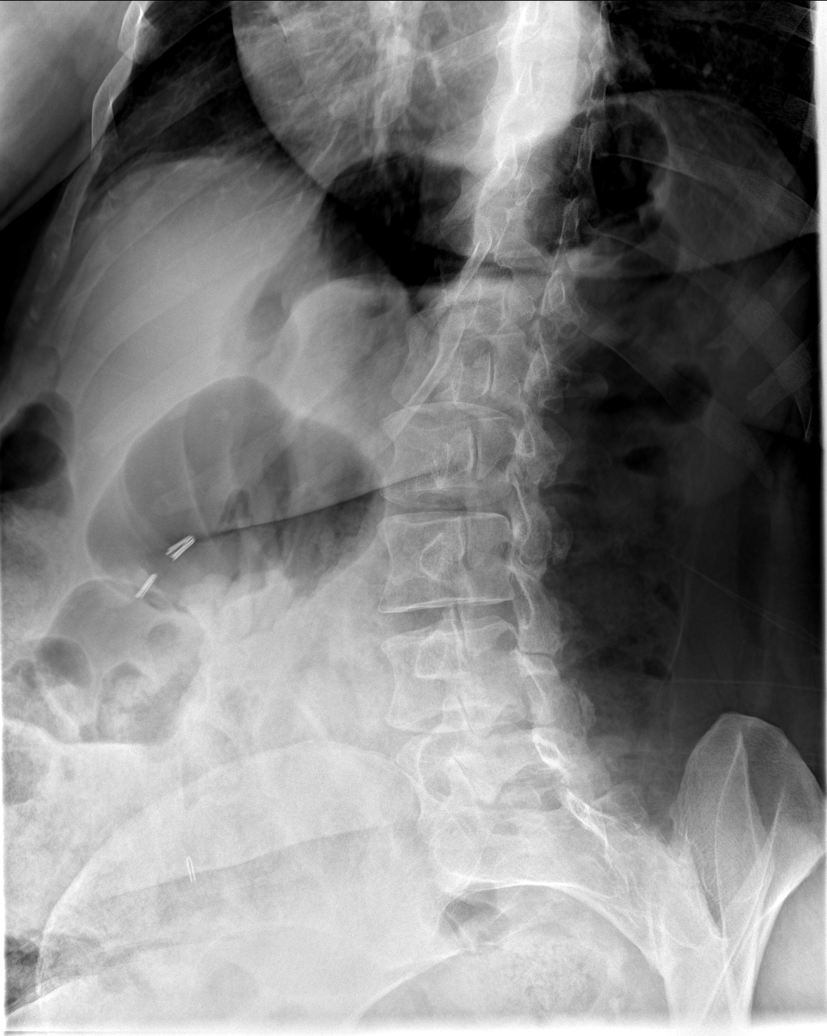

[lspine obl (2 of 2)]
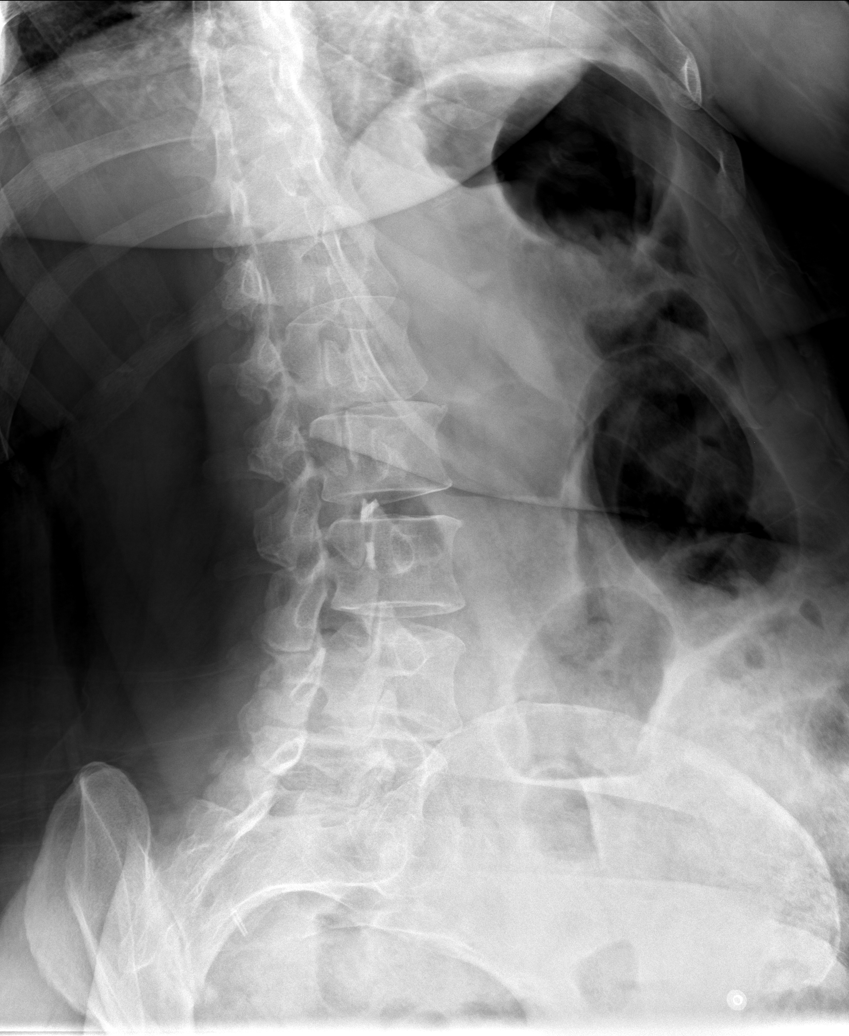

[lspine lat]
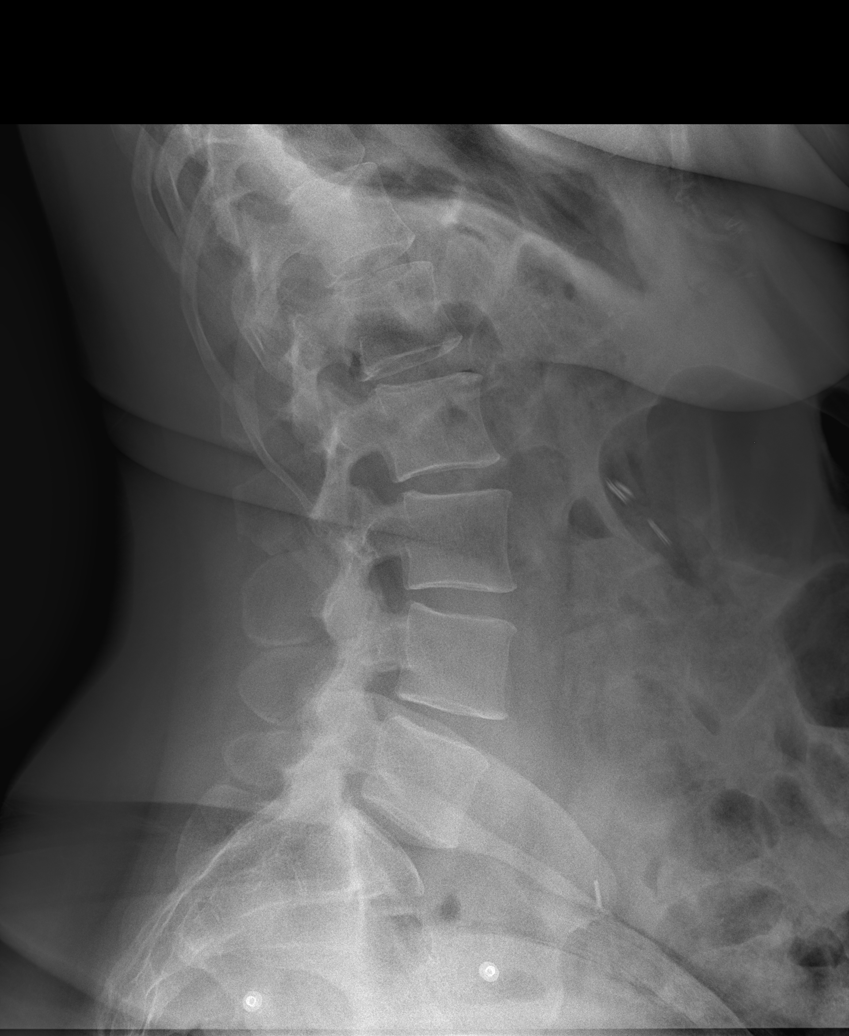

[lspine l5-s1]
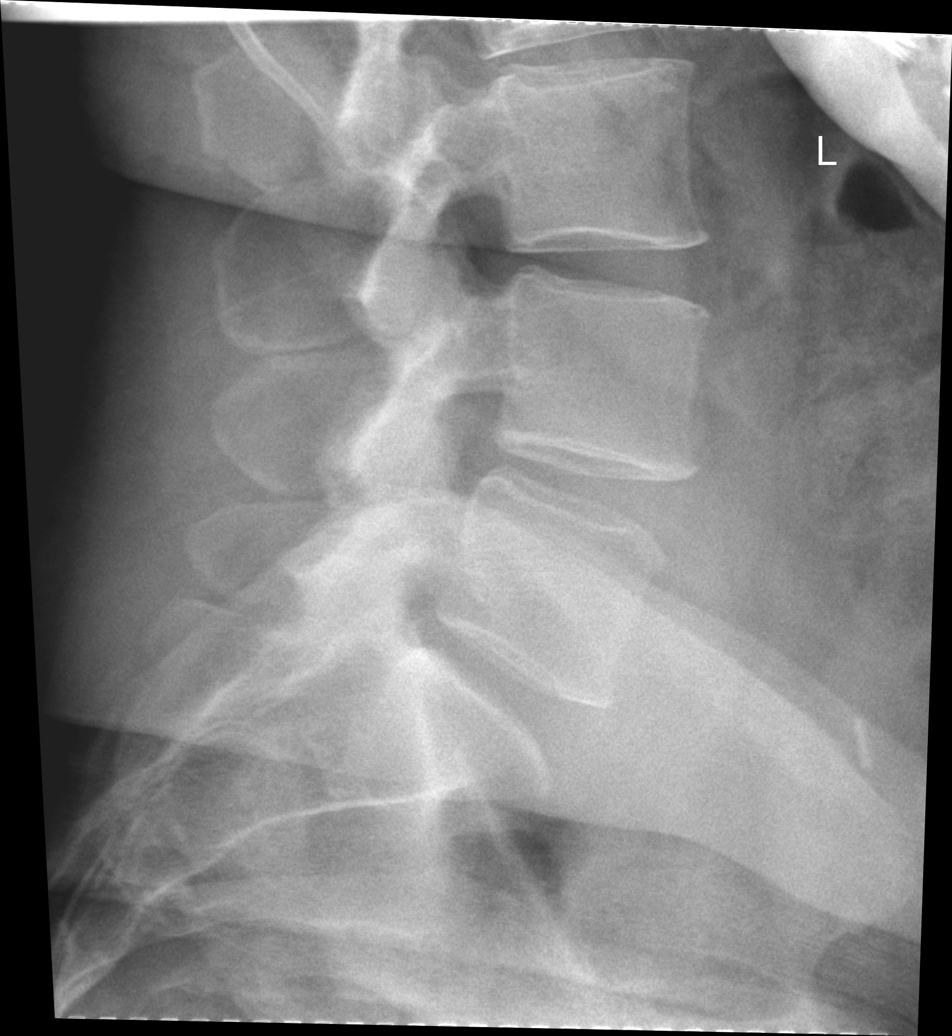

[5 of 5 positions shown; findings below may reference images not displayed]

DIAGNOSTIC STUDIES

EXAM

XR lumbar spine 5 view

INDICATION

chronic lower back pain, left sciatica
lower back pain that goes down left leg, denies trauma

TECHNIQUE

AP lateral both oblique and spot film

COMPARISONS

None available

FINDINGS

Mild facet hypertrophy is noted throughout the lumbar spine. Surgical clips are noted the right
upper quadrant. Minimal disc space narrowing is seen at L1-2, L2-3, and L3-4. No compression
fractures are evident. Facet hypertrophy is seen at L4-5 and L5-S1.

IMPRESSION

Minor degenerative changes as described.

Tech Notes:

lower back pain that goes down left leg, denies trauma

## 2021-09-23 IMAGING — CR [ID]
5 series · 5 of 5 positions shown · non-contrast
Comparison: none

[cspine lat]
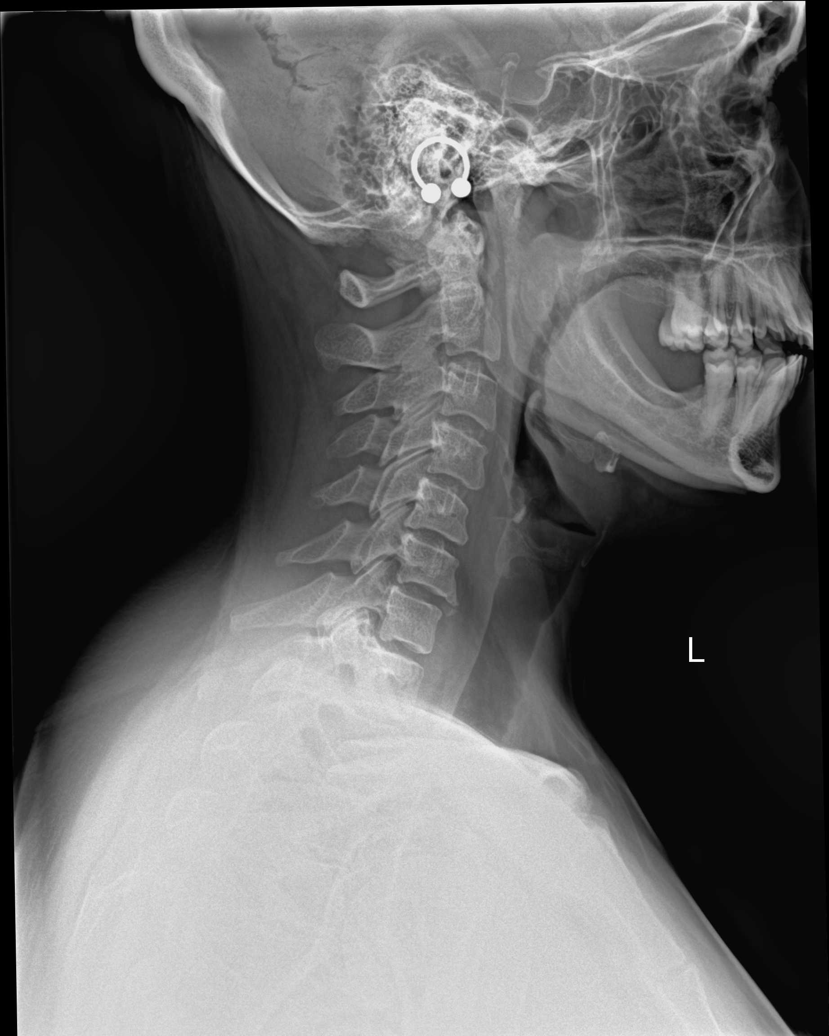

[cspine obl (1 of 2)]
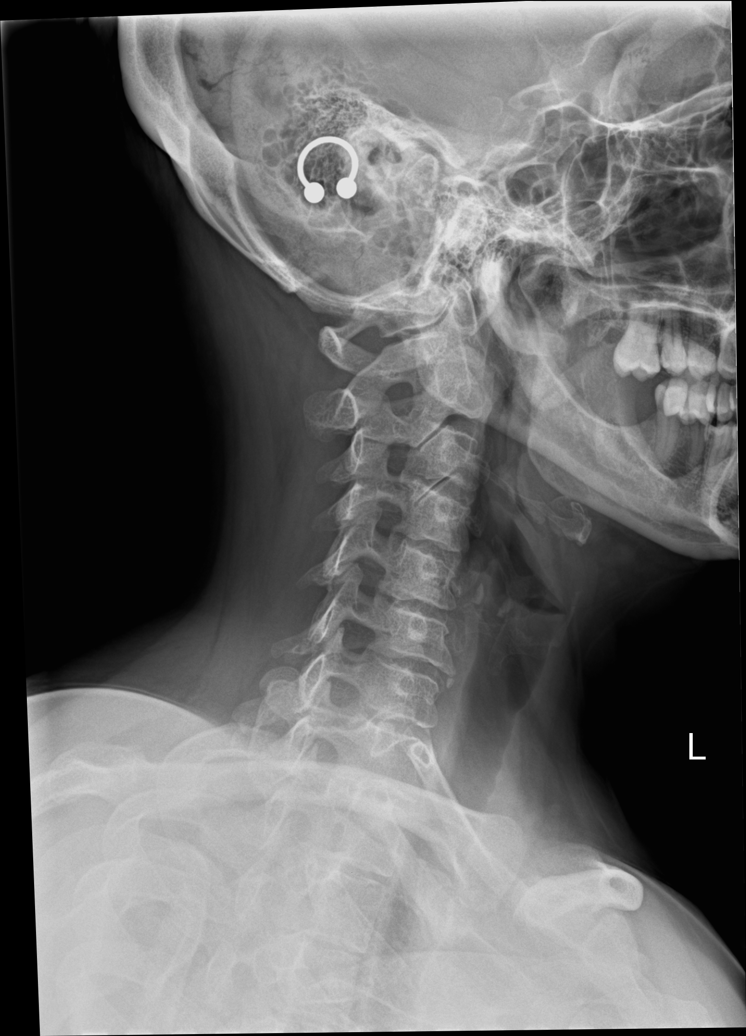

[cspine obl (2 of 2)]
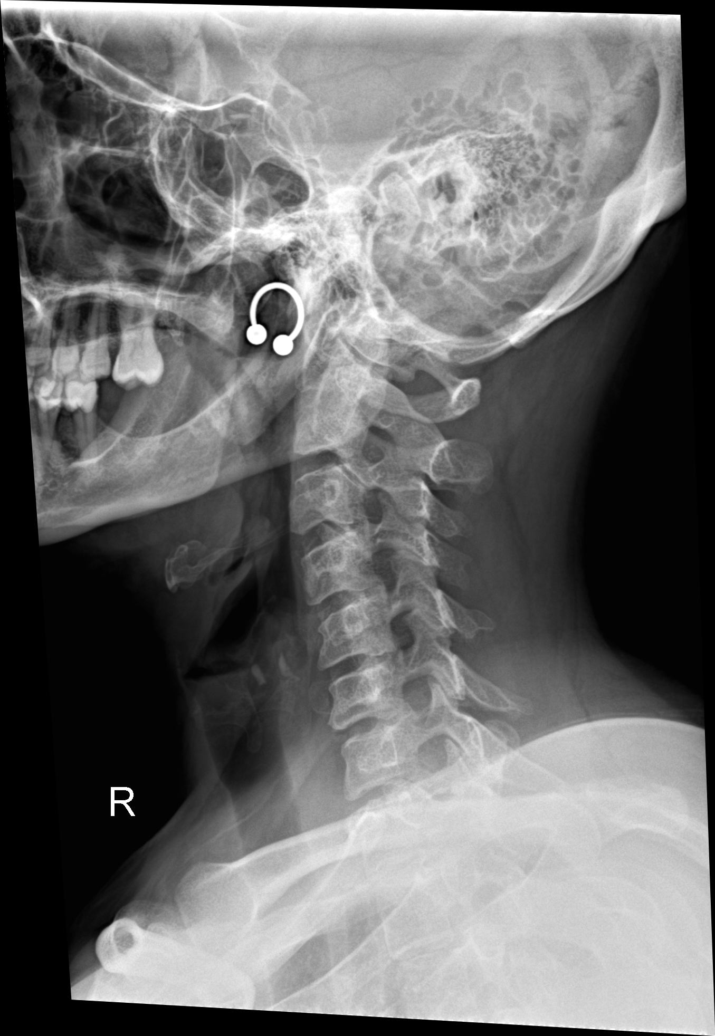

[cspine ap]
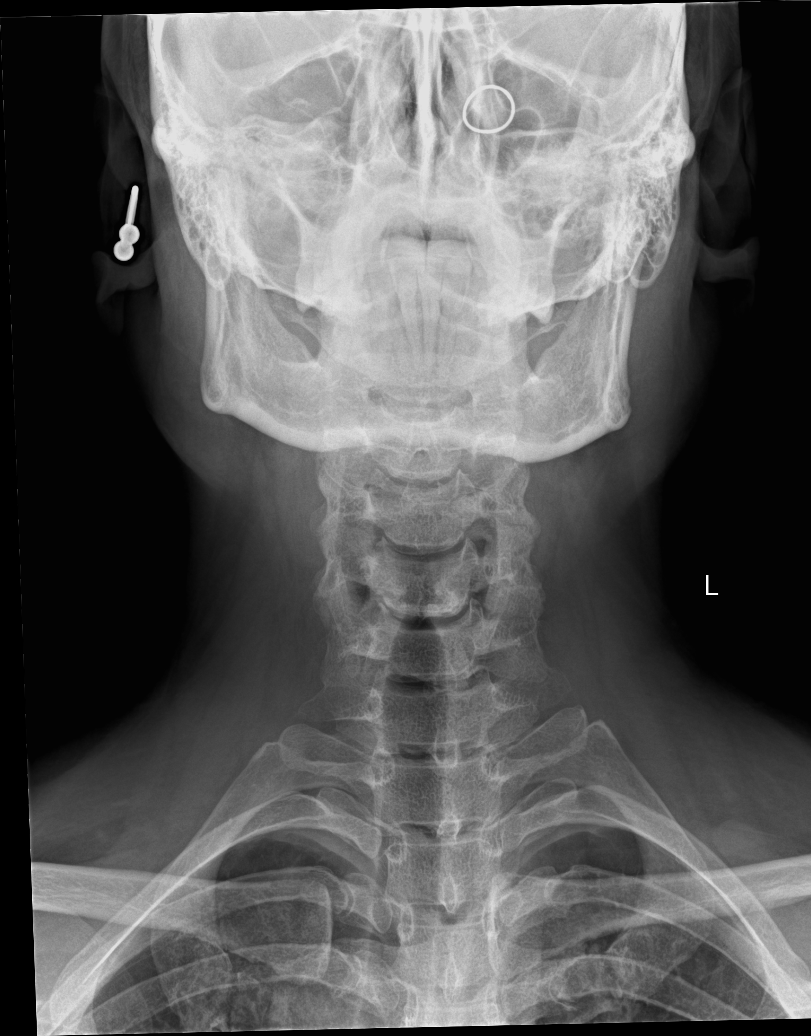

[cspine odontoid]
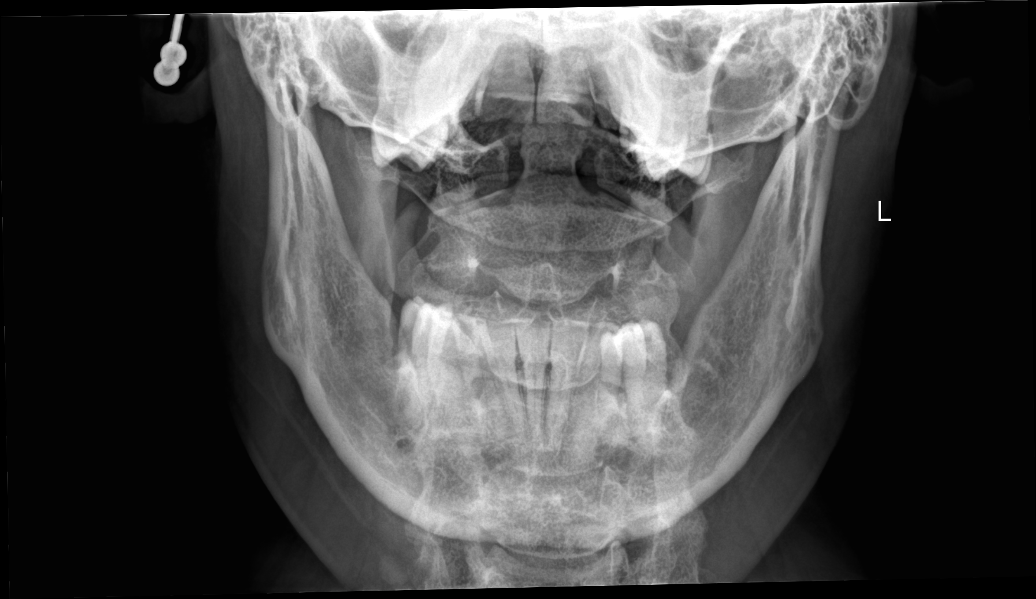

[5 of 5 positions shown; findings below may reference images not displayed]

DIAGNOSTIC STUDIES

EXAM

XR cervical spine 5V

INDICATION

chronic neck pain and radiculopathy
neck pain, denies trauma

TECHNIQUE

AP lateral both oblique and odontoid views

COMPARISONS

None available

FINDINGS

No fractures or dislocations are seen. Minimal disc space narrowing is seen at C5-6 and C6-7
anterior osteophytic spurring at C6-7. Bony neural foramina are well preserved.

IMPRESSION

Minimal degenerative spondylitic changes at C5-6 and C6-7.

Tech Notes:

neck pain, denies trauma

## 2021-10-24 IMAGING — RF XR foot RT 2V
1 series · 3 of 3 positions shown · non-contrast
Comparison: none

[Series 1: run · 3 of 3 slices shown]
[im 1/3]
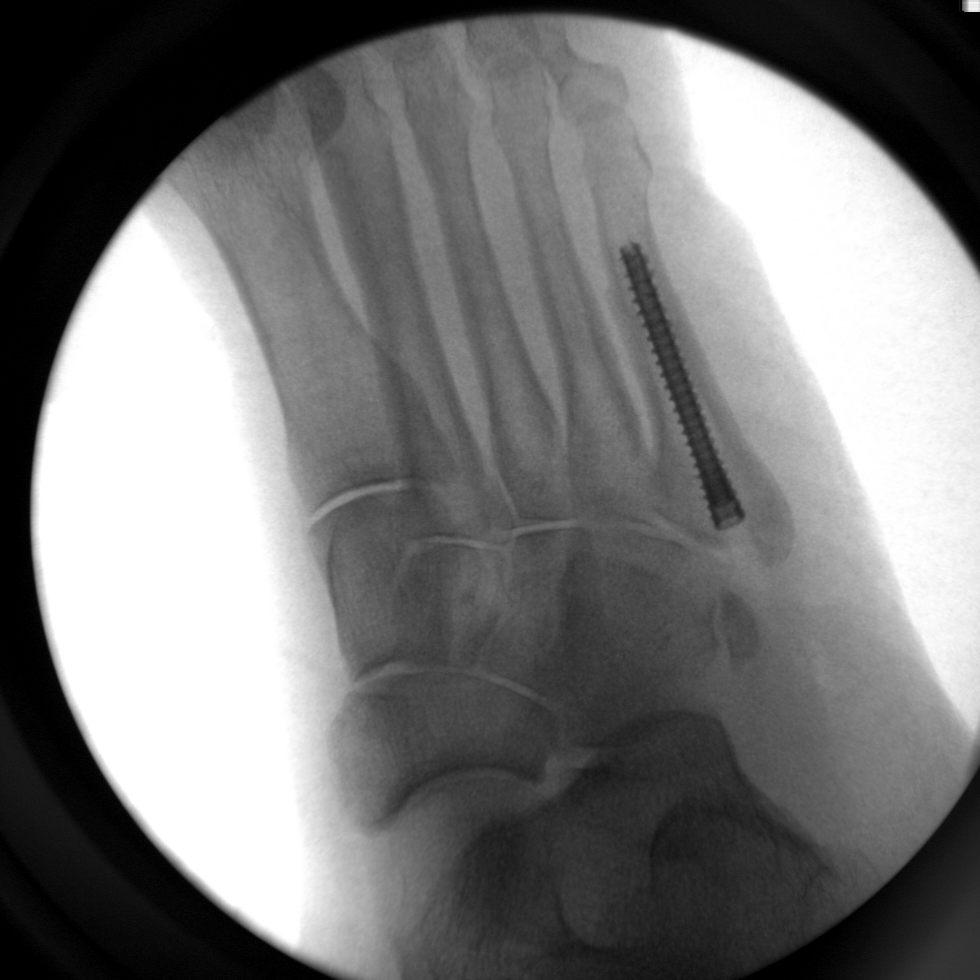
[im 2/3]
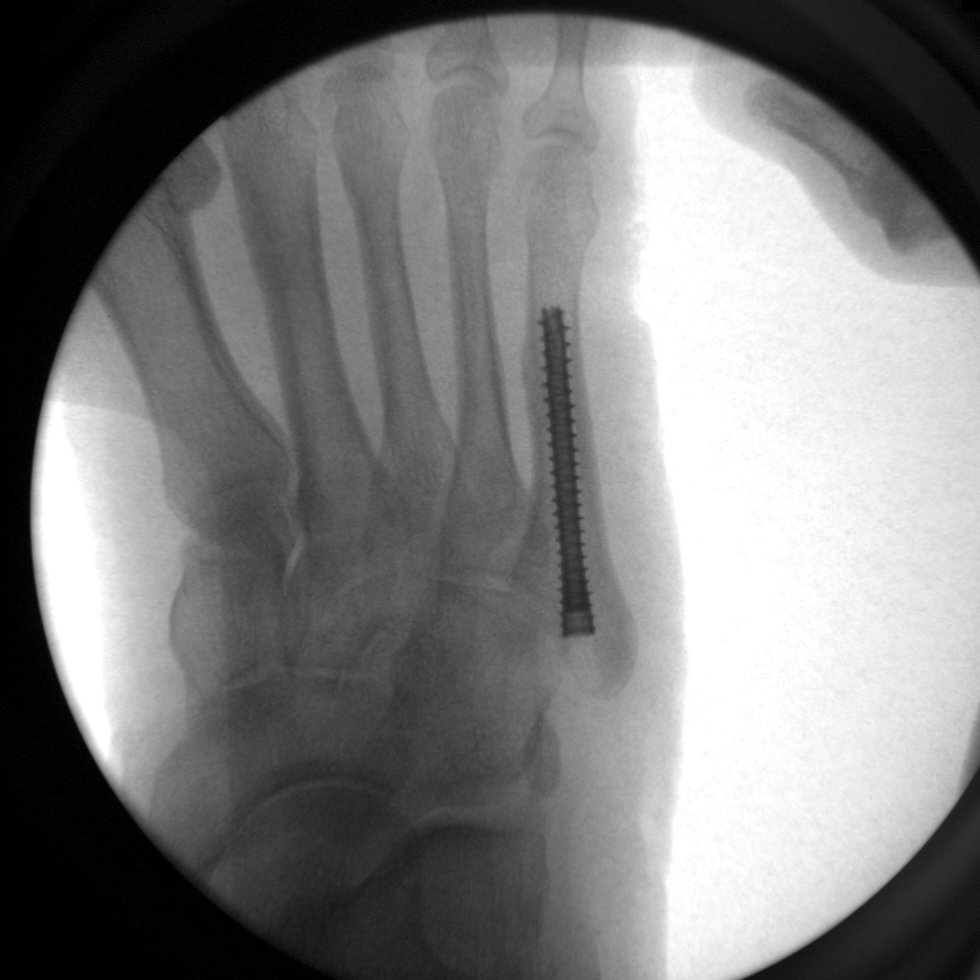
[im 3/3]
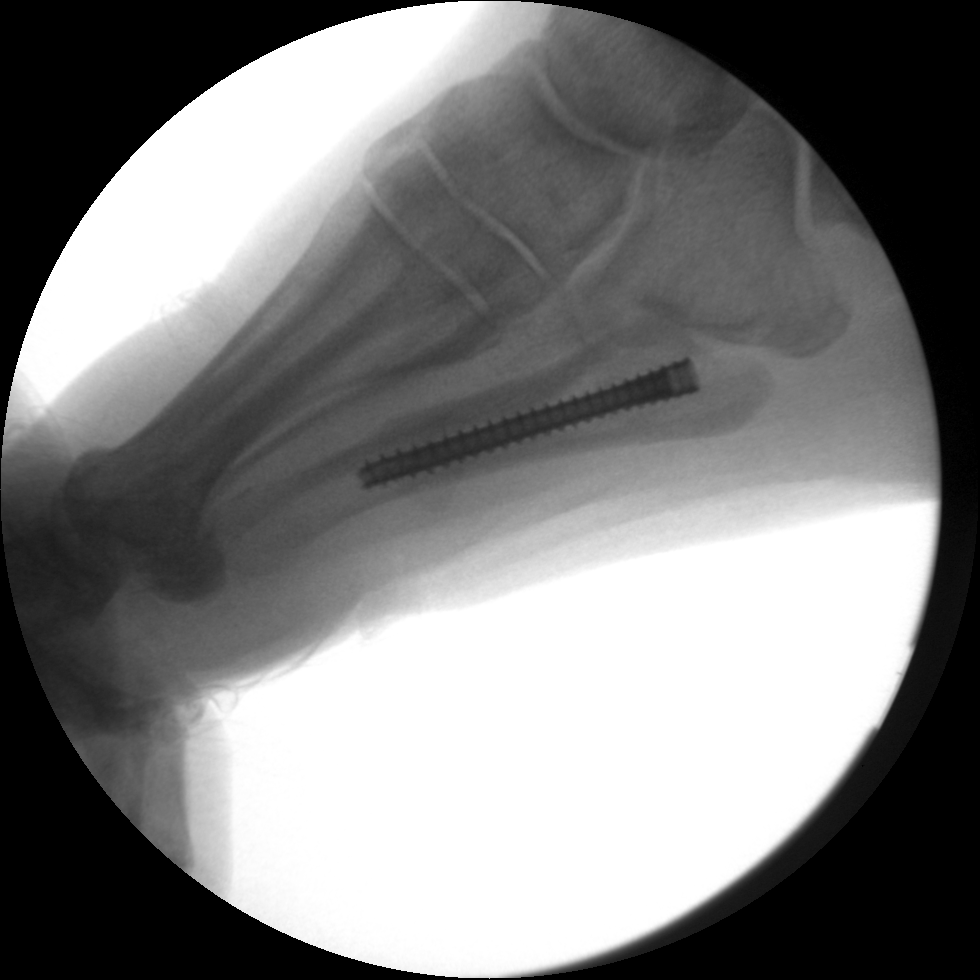

[3 of 3 positions shown; findings below may reference images not displayed]

EXAM

XR foot RT 2V

INDICATION

ORIF R 5th metatarsal
open treatment of st 5th metatarsal

TECHNIQUE

Fluoroscopic guidance was utilized

COMPARISONS

None available at the time of dictation.

FINDINGS

30.3 seconds of fluoroscopic time.

0.5 mVycm3 cumulative dose.

IMPRESSION
1. Successful fluoroscopic guidance.

Tech Notes:

open treatment of st 5th metatarsal

## 2021-11-04 IMAGING — CR [ID]
3 series · 3 of 3 positions shown · non-contrast
Comparison: none

[x foot ap right]
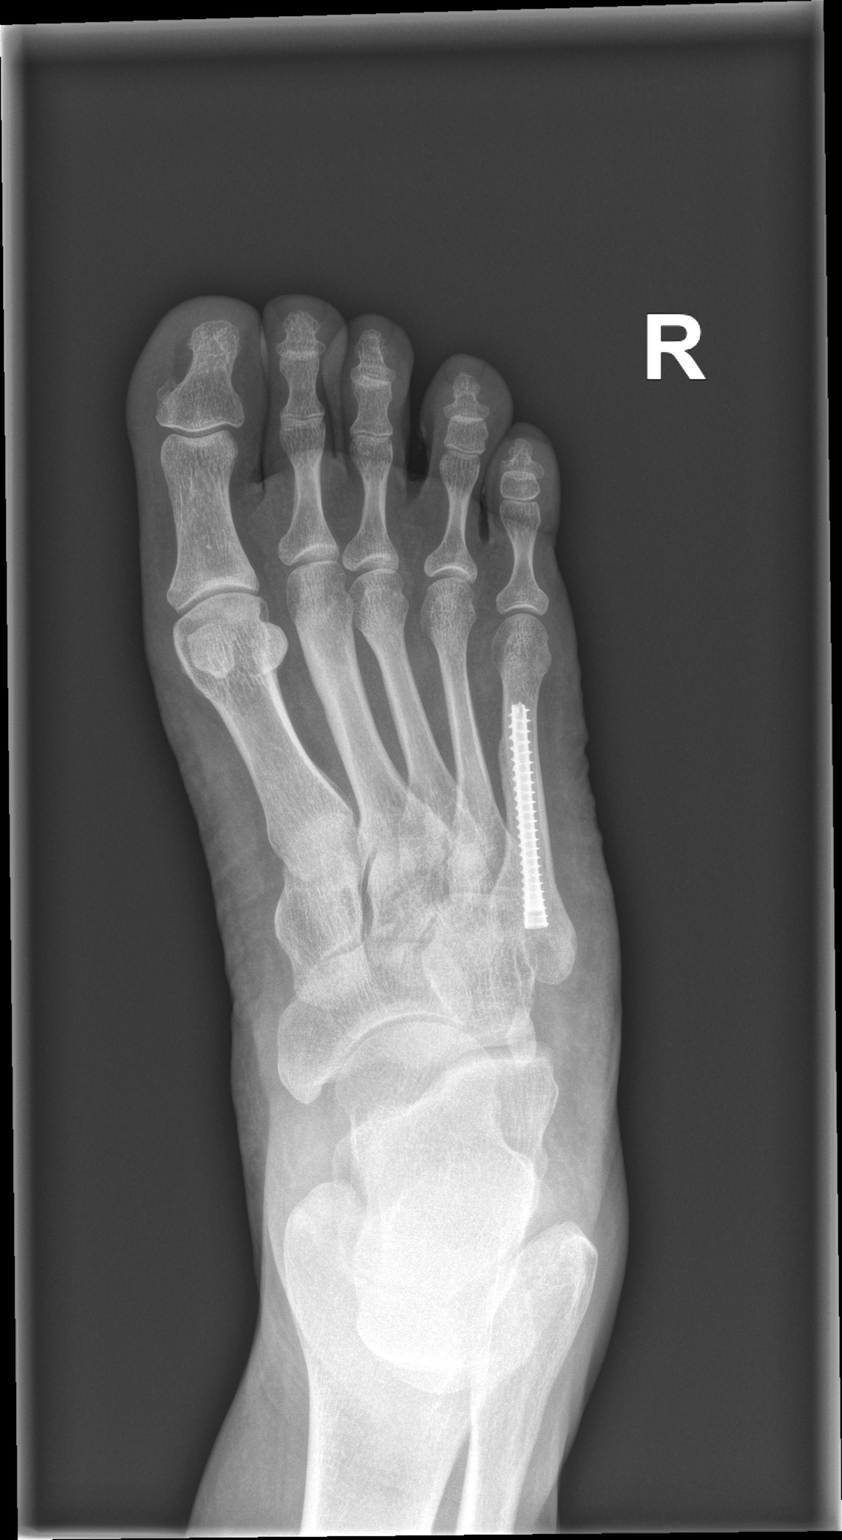

[x foot obl right]
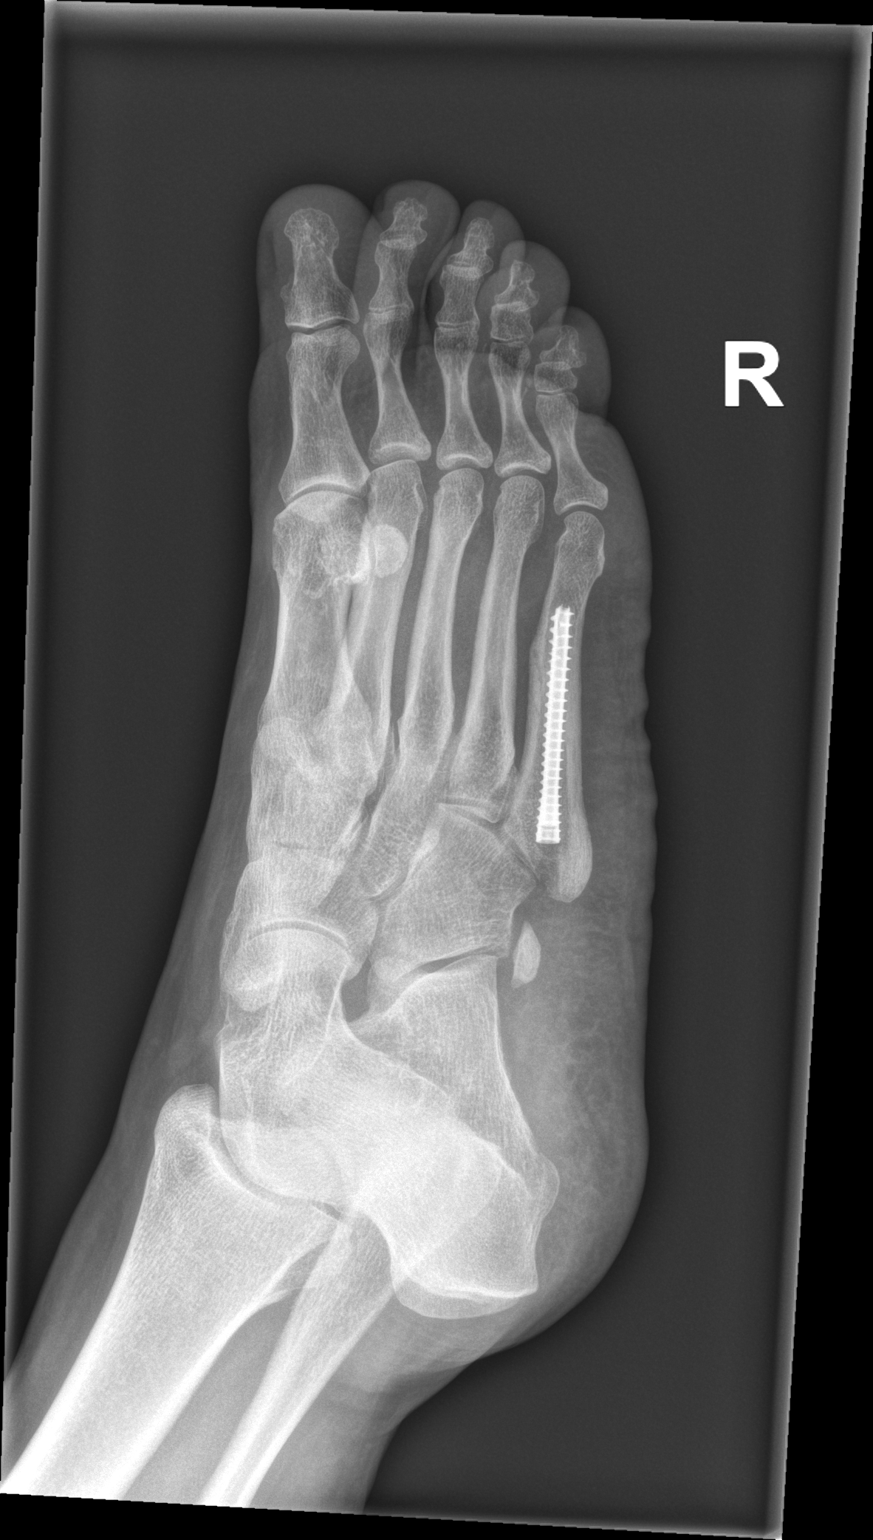

[x foot lat right]
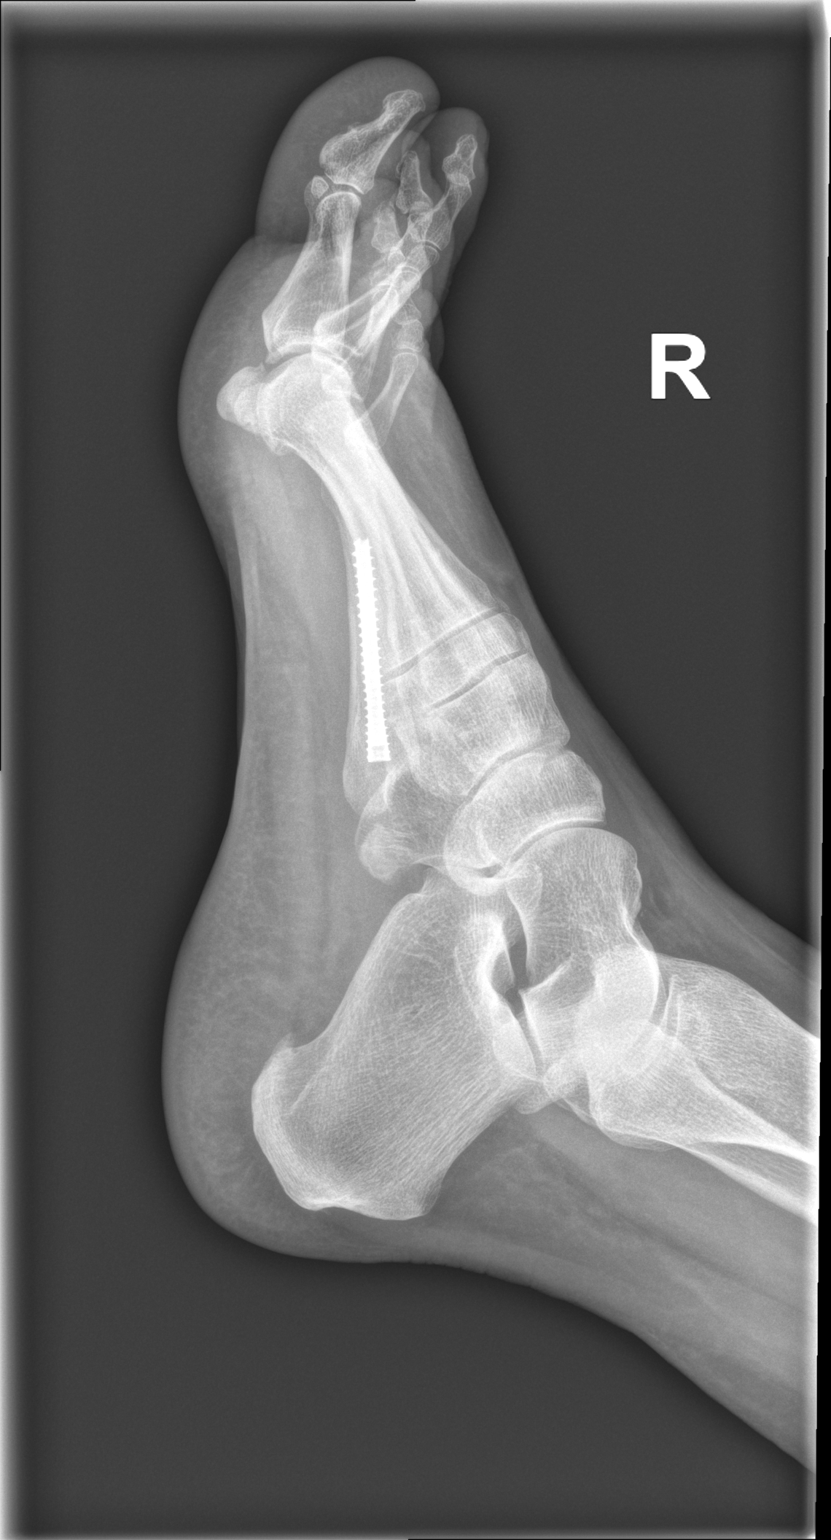

[3 of 3 positions shown; findings below may reference images not displayed]

DIAGNOSTIC STUDIES

EXAM

XR foot RT min 3V

INDICATION

increasing pain
C/O RT FOOT PAIN THAT RADIATES UP LEG. H/O ORIF X 10 DAYS AGO. HB

TECHNIQUE

AP lateral and oblique views

COMPARISONS

October 24, 2021

FINDINGS

Patient is status post ORIF of 5th metatarsal. No acute fractures are seen. Old fracture of the 2nd
metatarsal noted.

IMPRESSION

Postop changes 5th metatarsal. No acute fractures are seen.

Tech Notes:

C/O RT FOOT PAIN THAT RADIATES UP LEG. H/O ORIF X 10 DAYS AGO. HB

## 2021-11-04 IMAGING — US VDUPLERT
1 series · 14 of 25 positions shown · non-contrast
Comparison: none

[Series 1: us venous duplex low ext right · portal-venous · 14 of 41 slices shown]
[im 1/41]
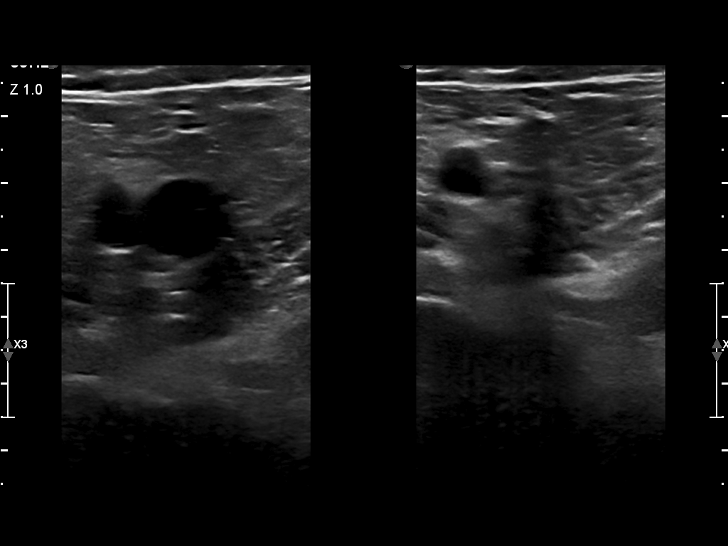
[im 4/41]
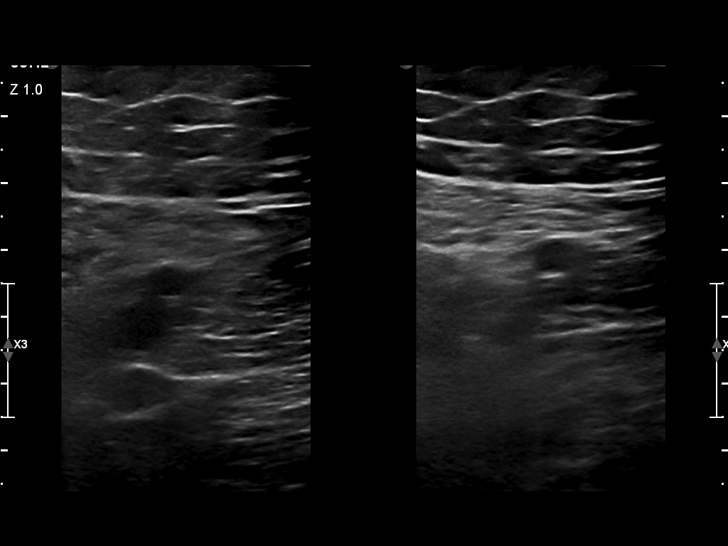
[im 7/41]
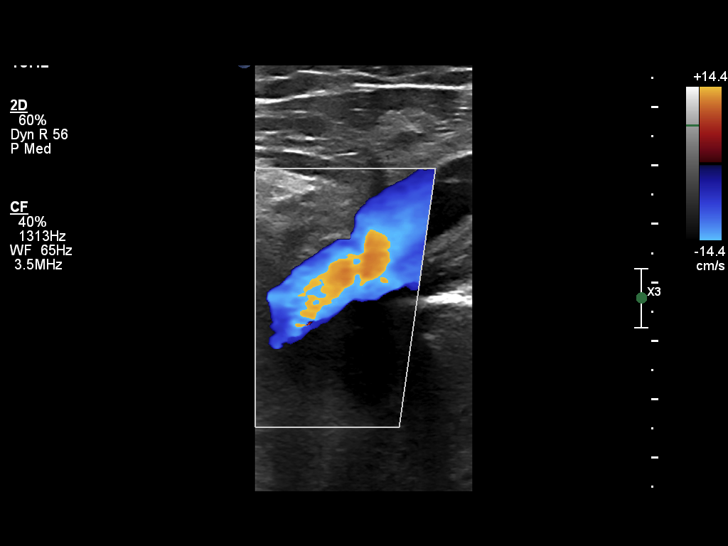
[im 11/41]
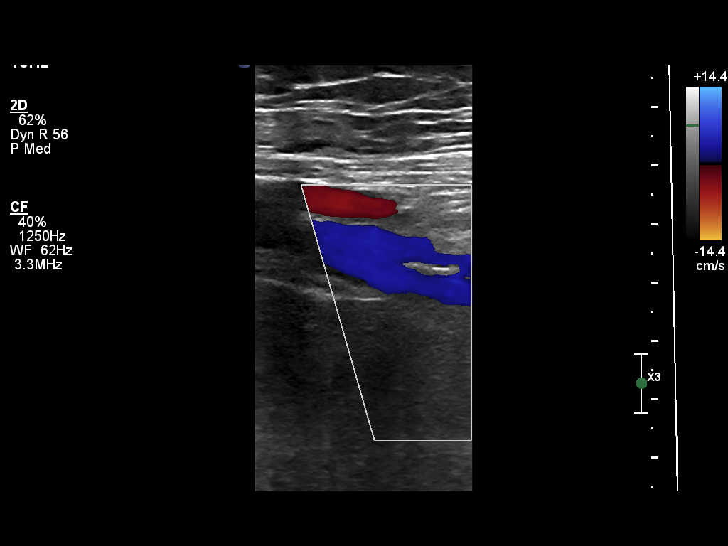
[im 14/41]
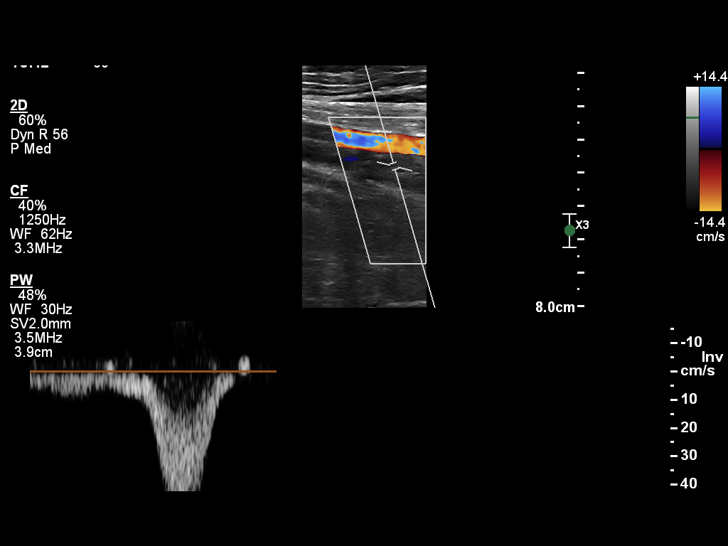
[im 16/41]
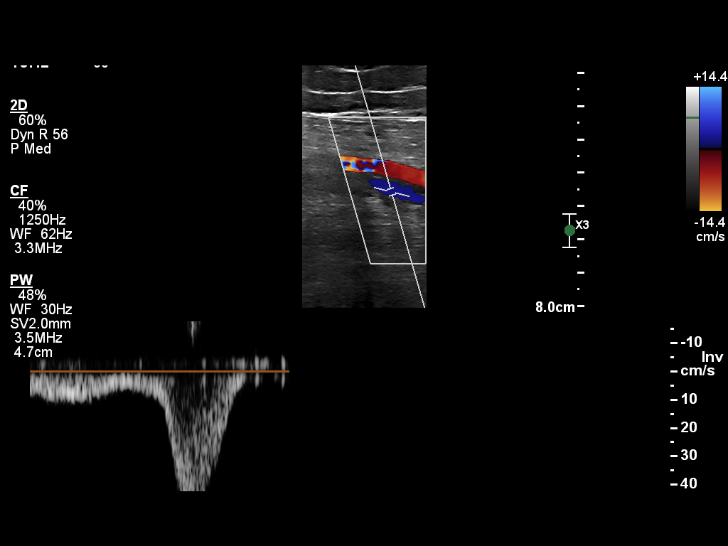
[im 19/41]
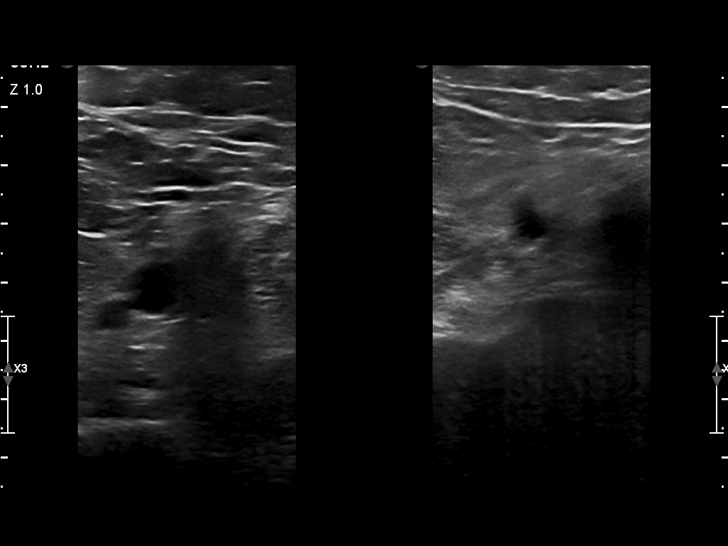
[im 22/41]
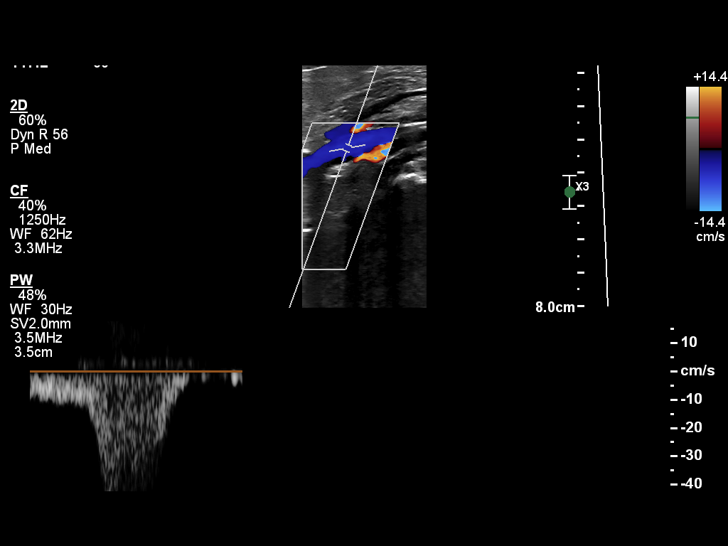
[im 26/41]
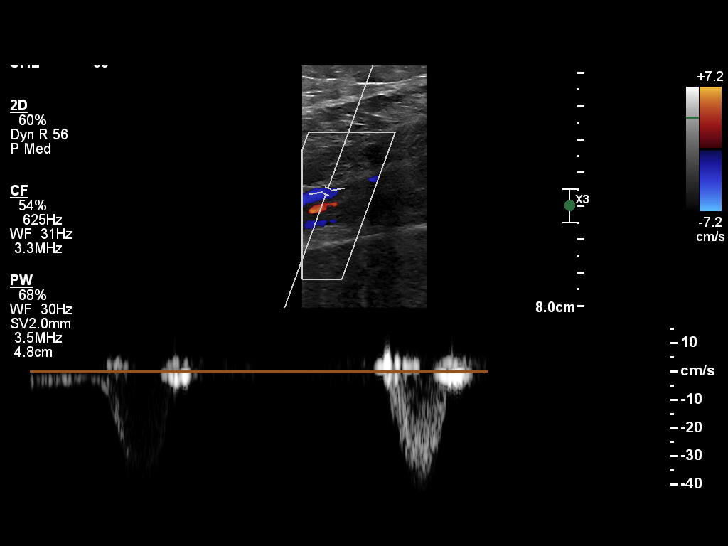
[im 27/41]
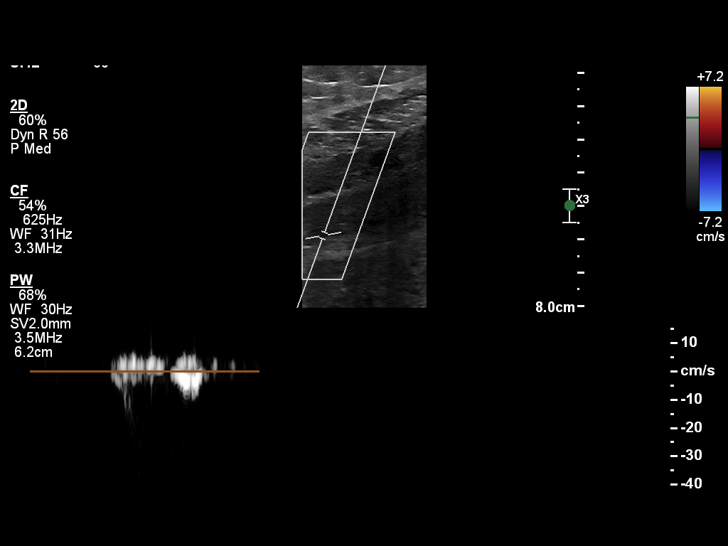
[im 31/41]
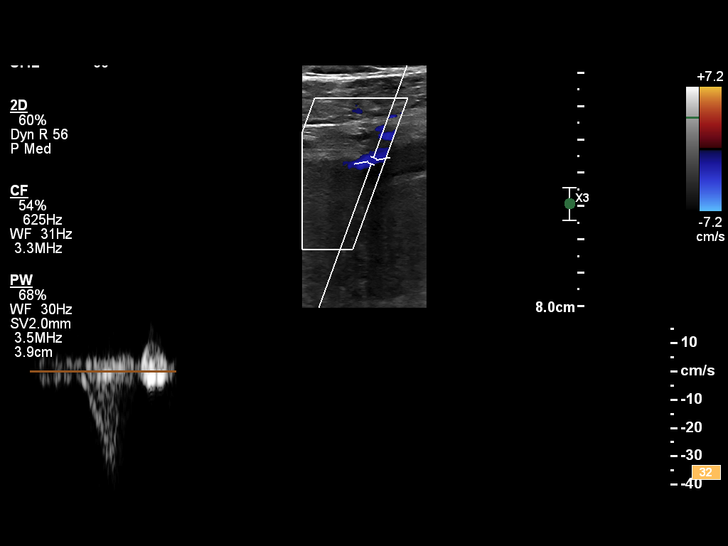
[im 34/41]
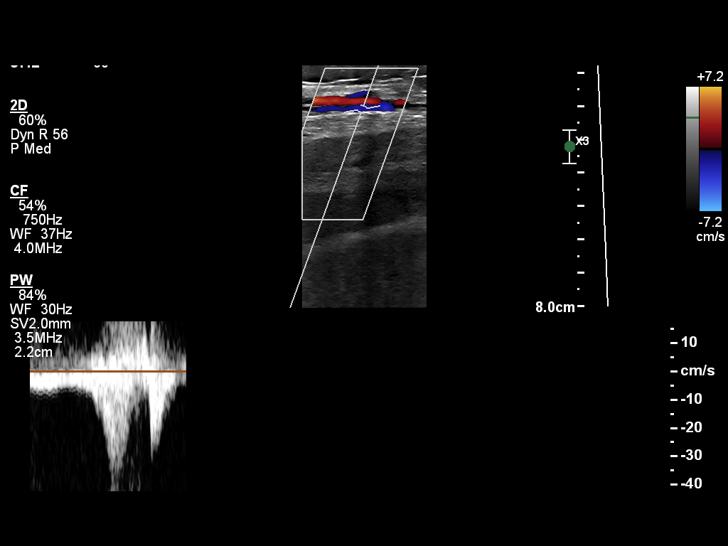
[im 37/41]
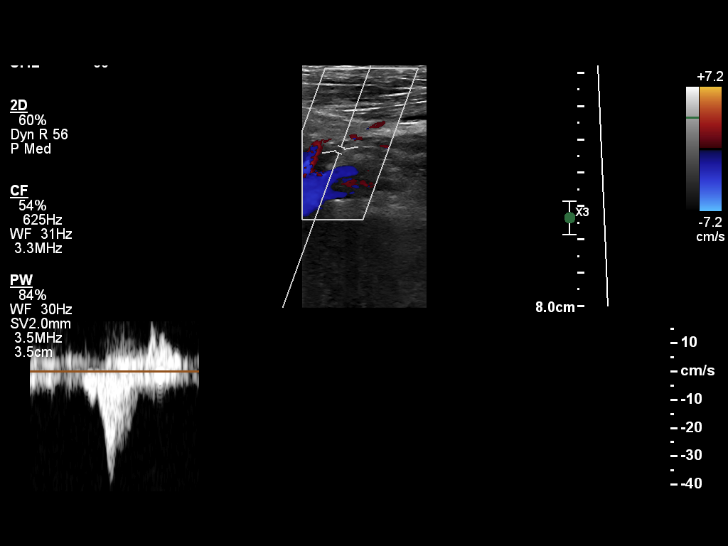
[im 41/41]
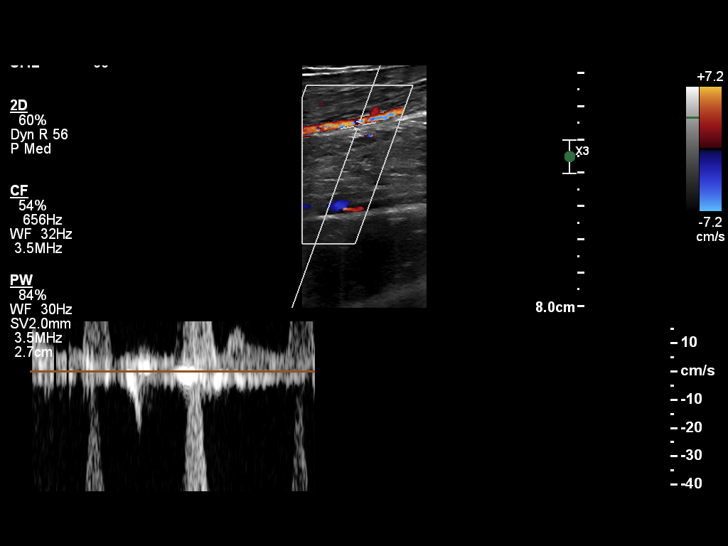

[14 of 25 positions shown; findings below may reference images not displayed]

DIAGNOSTIC STUDIES

EXAM

Right low venous Doppler ultrasound

INDICATION

recent procedure/calf pain

TECHNIQUE

High-resolution duplex imaging right lower extremity was obtained. This includes color, Doppler,
and spectral analysis

COMPARISONS

None available

FINDINGS

There is normal flow augmentation and compressibility throughout the right lower extremity without
evidence for deep venous thrombosis.

IMPRESSION

Normal right low venous Doppler ultrasound. Report called to ER nurse Norge at [DATE] a.m..

Tech Notes:

## 2021-11-29 IMAGING — CT BRAIN WO(Adult)
2 of 4 series · 12 of 47 positions shown, 15 images · non-contrast
Comparison: No relevant prior studies available.

DIAGNOSTIC STUDIES

EXAM:  CT HEAD WITHOUT INTRAVENOUS CONTRAST  ([id number])
INDICATION: headache headache, n/v. denies preg. AK
TECHNIQUE: Axial computed tomography images of the head/brain without intravenous contrast.
Sagittal and coronal reformatted images were created and reviewed.

[Series 4: brain cor 5.00 hr40 s3 · coronal · 0.28mm/px · 3 of 41 slices shown]
[im 14/41  brain]
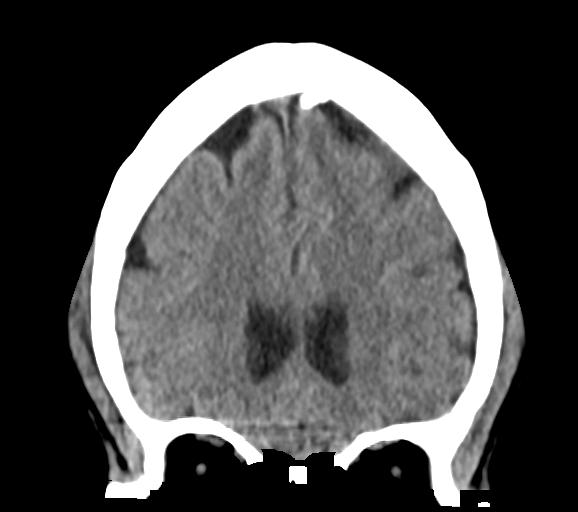
[im 18/41  brain]
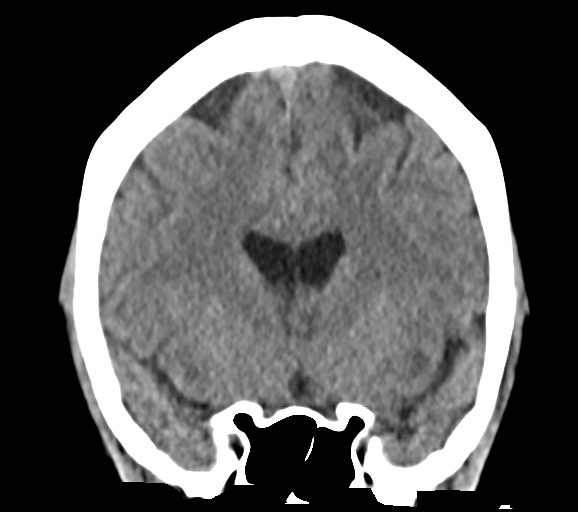
[im 23/41  brain]
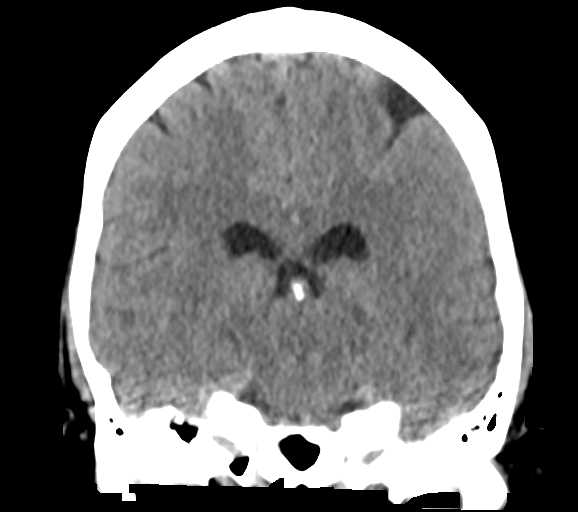

[Series 8: brain ax 2.00 hr60 s3 · axial · 0.32mm/px · z∈[-527,-409]mm · 9 of 73 slices shown, 12 images]
[im 7/73  brain]
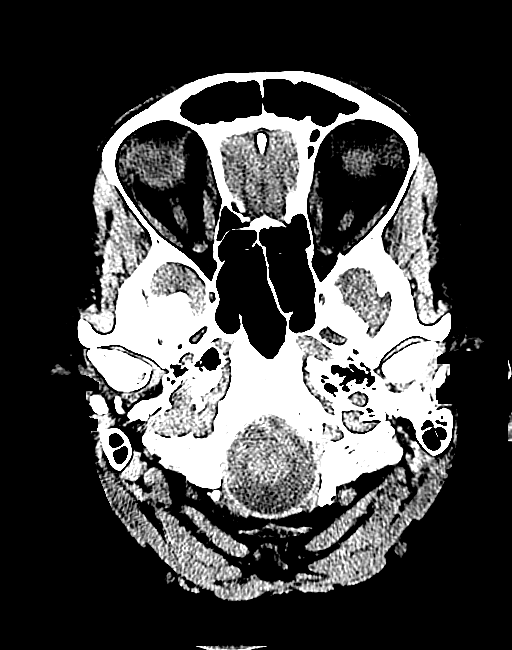
[im 7/73  bone]
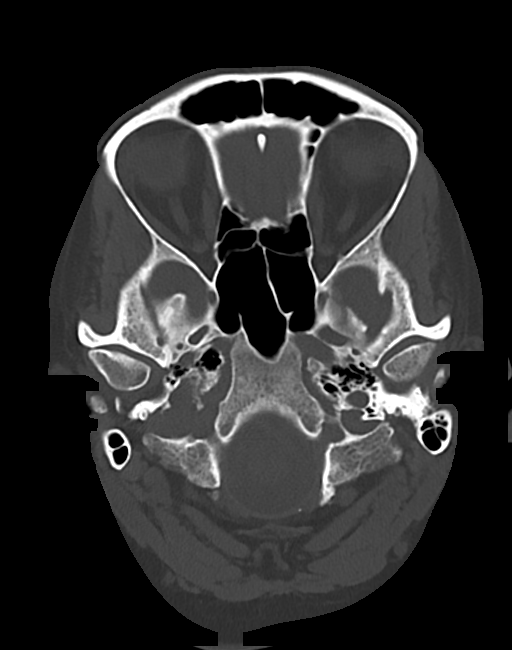
[im 14/73  brain]
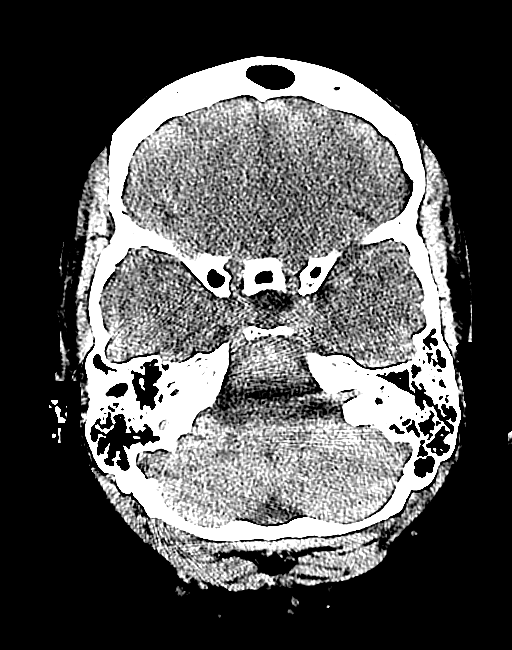
[im 21/73  brain]
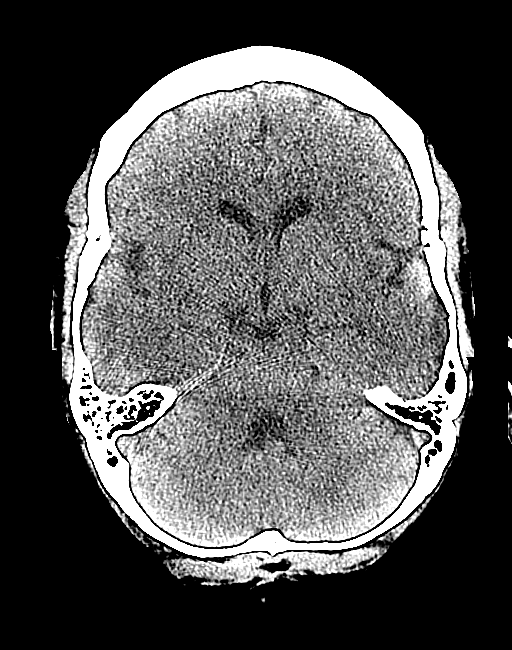
[im 28/73  brain]
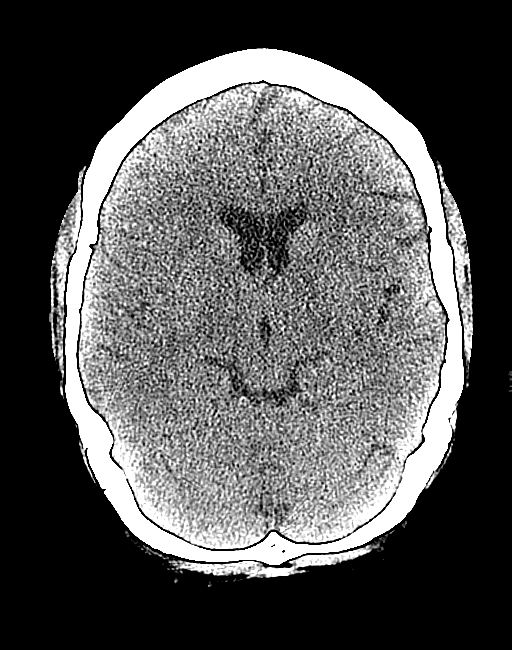
[im 38/73  brain]
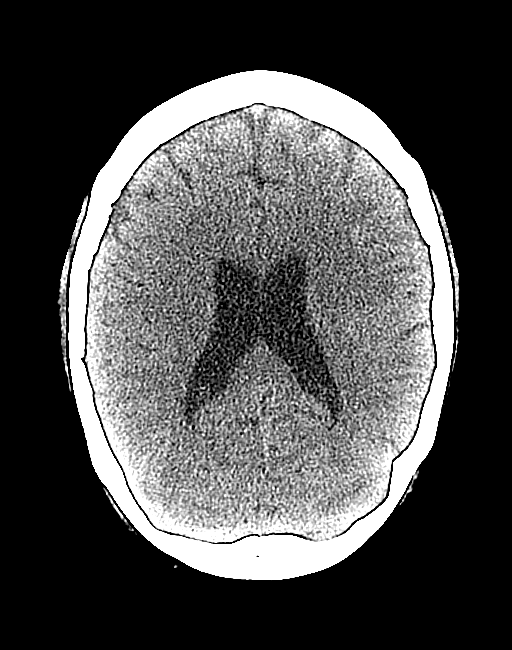
[im 38/73  bone]
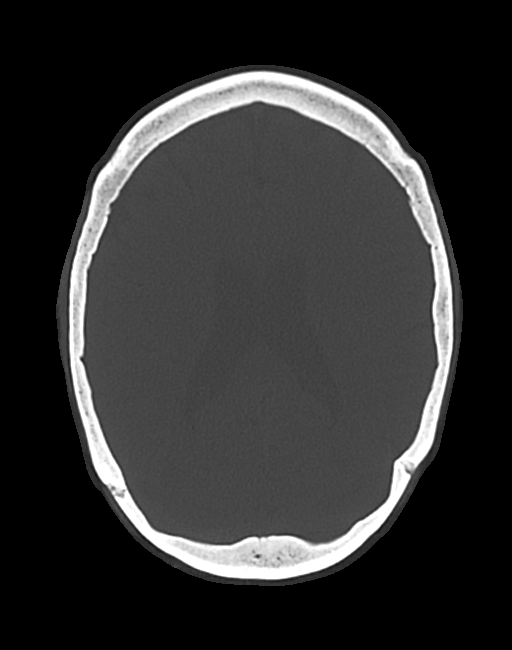
[im 45/73  brain]
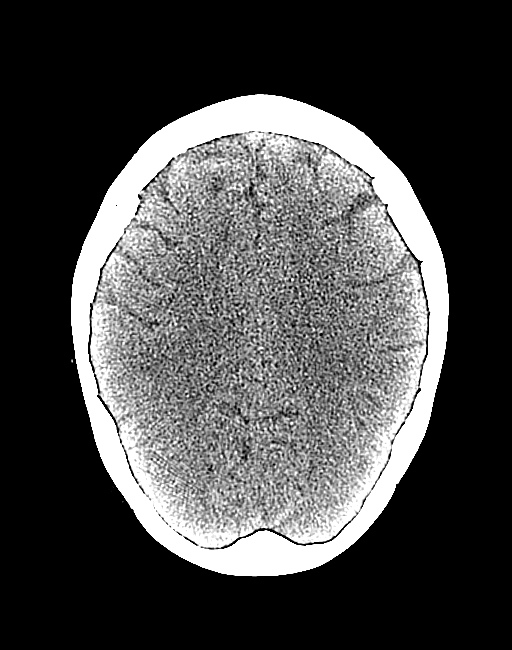
[im 52/73  brain]
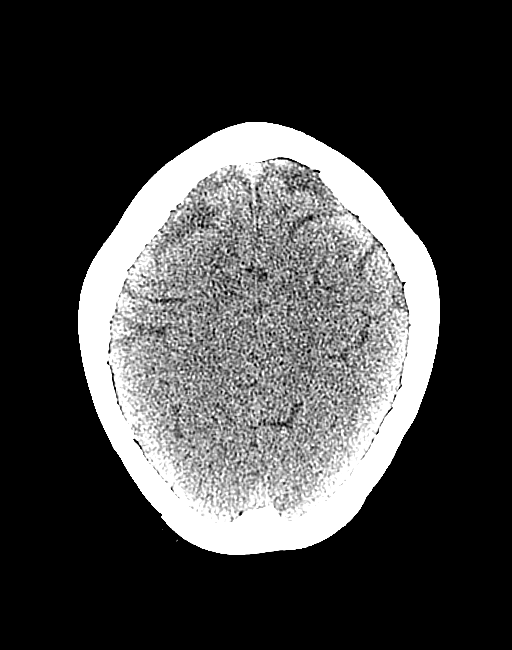
[im 59/73  brain]
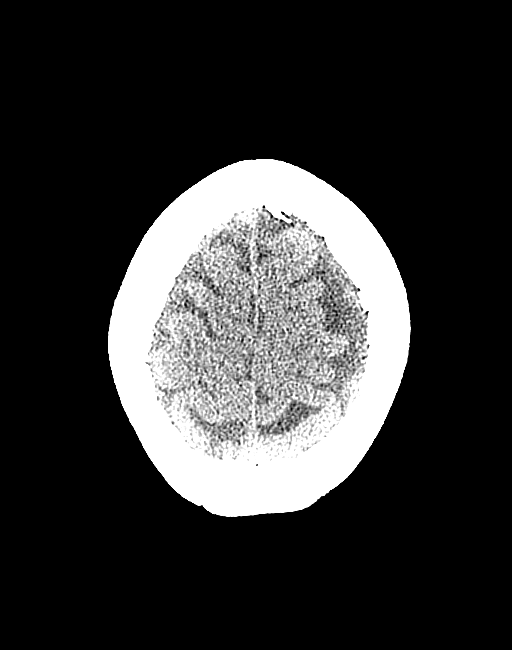
[im 66/73  brain]
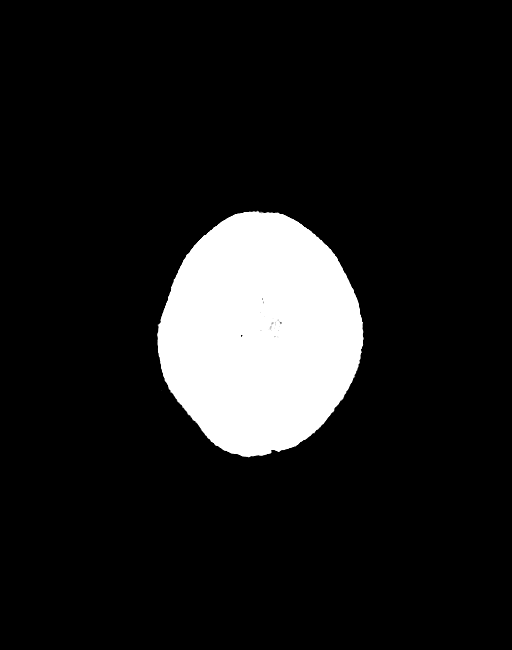
[im 66/73  bone]
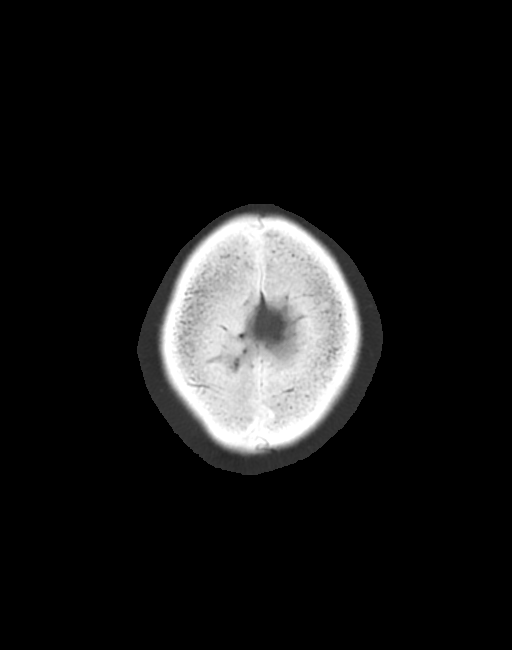

[12 of 47 positions shown; findings below may reference images not displayed]

All CT scans at this facility use dose modulation, interval reconstruction, and/or weight-based
dosing when appropriate to reduce radiation dose to as low as reasonably achievable.

Number of previous computed tomography exams in the last 12 months is 0. Number of previous nuclear
medicine myocardial perfusion studies in the last 12 months is 0.
FINDINGS: BRAIN:  Supratentorial brain demonstrates NO evidence of hemorrhage or mass. Posterior fossa
demonstrates NO evidence of hemorrhage or mass.

VENTRICLES:  The cerebral ventricles are normal in size.

BONES/JOINTS:  Calvarium and skull base demonstrate NO acute changes.

SOFT TISSUES:  Soft tissues demonstrate NO fluid collections or foreign body.

SINUSES:  NO opacifications or air-fluid levels.

MASTOID AIR CELLS:  NO opacifications or air-fluid levels.
IMPRESSION: - NO evidence of acute intracerebral hemorrhage or mass.

Tech Notes:

headache, n/v. denies preg. AK

## 2021-12-12 IMAGING — CT STONE PROTOCOL(Adult)
2 of 3 series · 11 of 46 positions shown, 12 images · non-contrast
Comparison: none

[Series 2: abdomen ax 2.00 br40 s3 · axial · 0.54mm/px · z∈[+1264,+1684]mm · 8 of 242 slices shown, 9 images]
[im 16/242  soft-tissue]
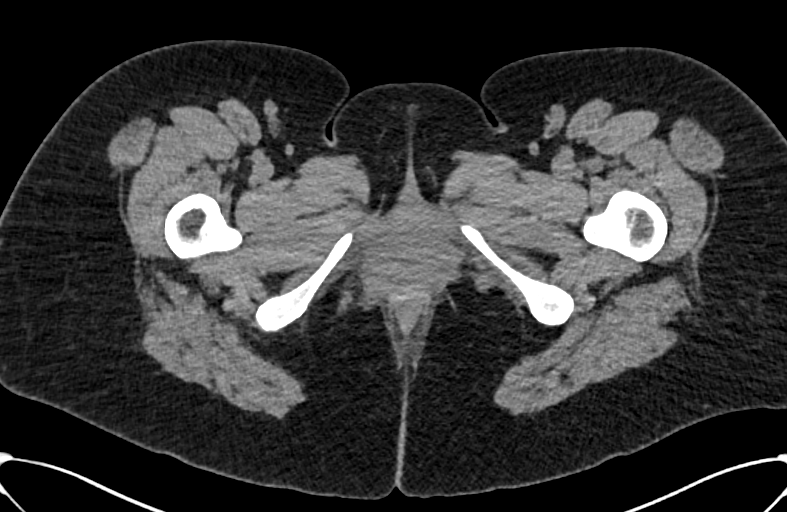
[im 16/242  bone]
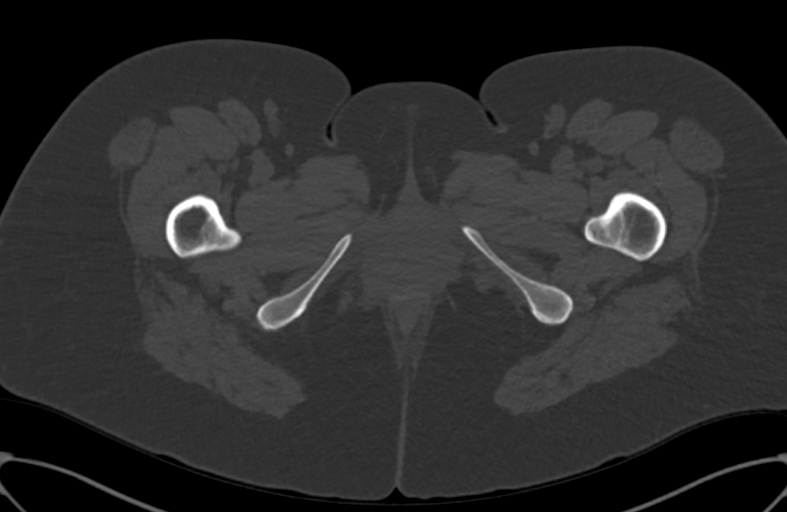
[im 47/242  soft-tissue]
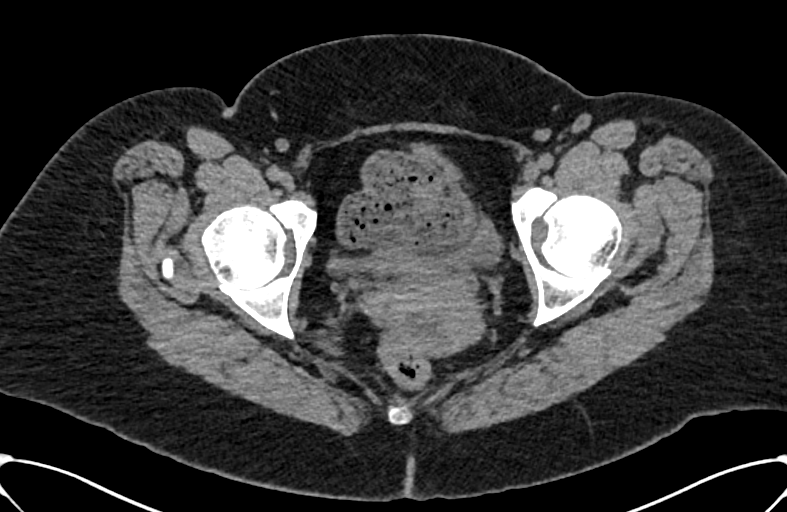
[im 78/242  soft-tissue]
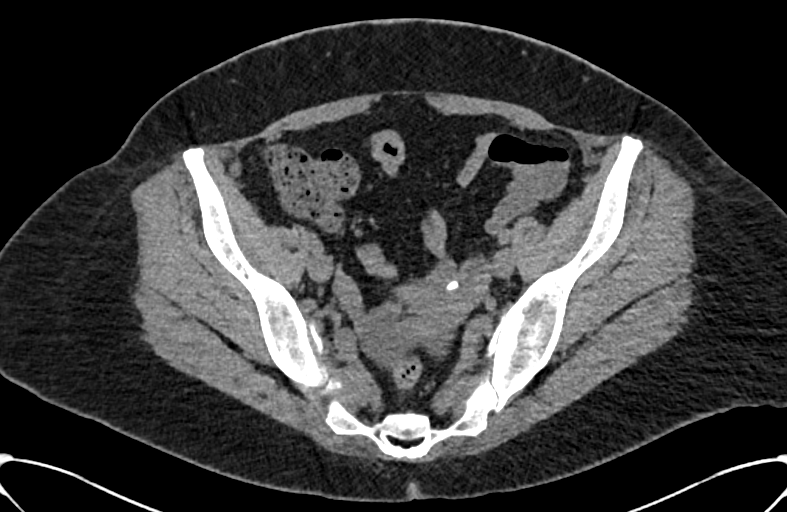
[im 109/242  soft-tissue]
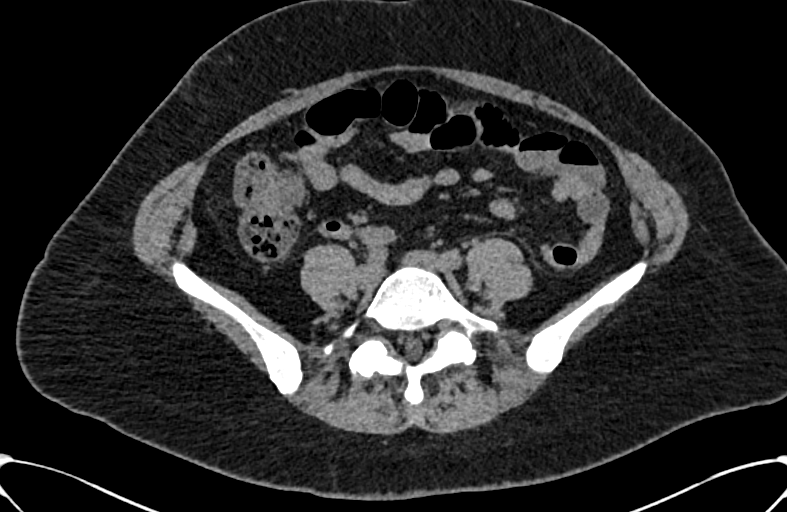
[im 133/242  soft-tissue]
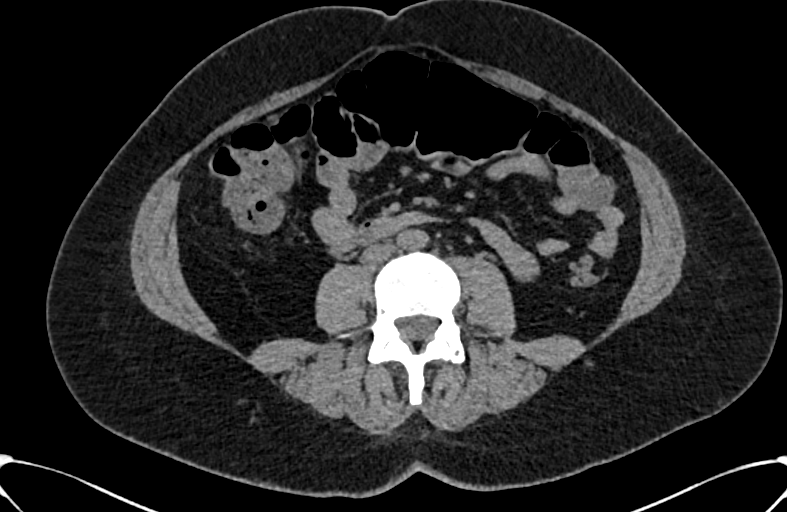
[im 164/242  soft-tissue]
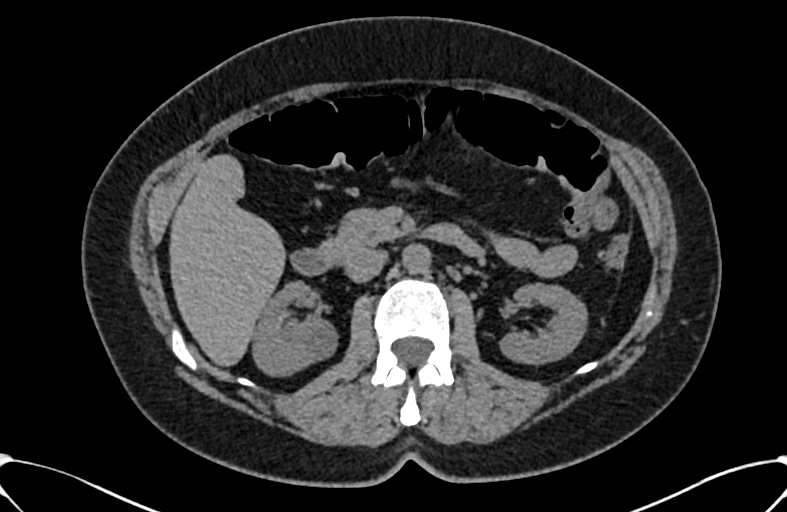
[im 195/242  soft-tissue]
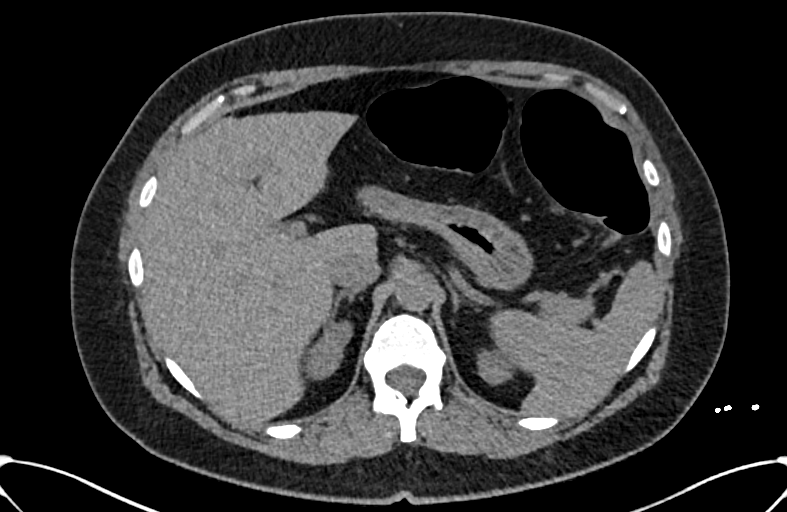
[im 226/242  soft-tissue]
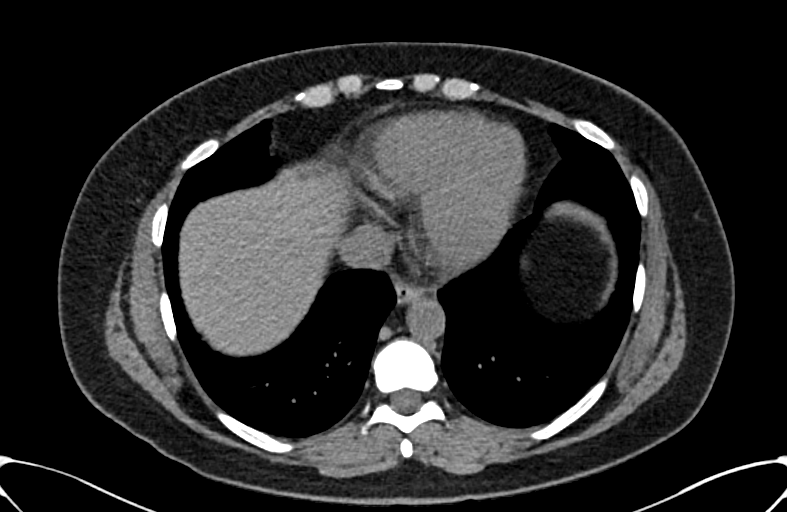

[Series 4: abdomen cor 2.00 br40 s3 · coronal · 0.83mm/px · 3 of 138 slices shown]
[im 46/138  soft-tissue]
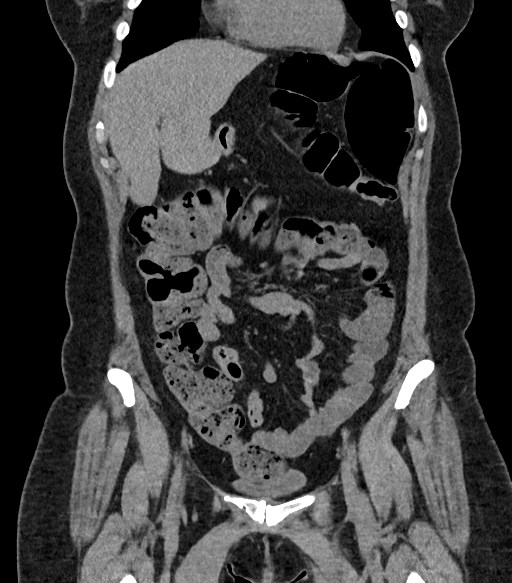
[im 61/138  soft-tissue]
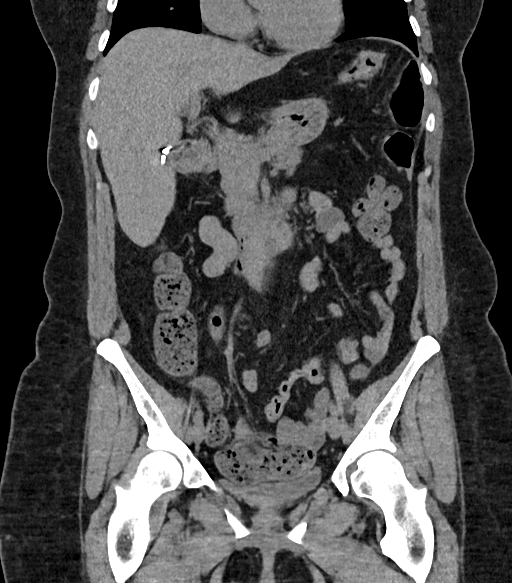
[im 77/138  soft-tissue]
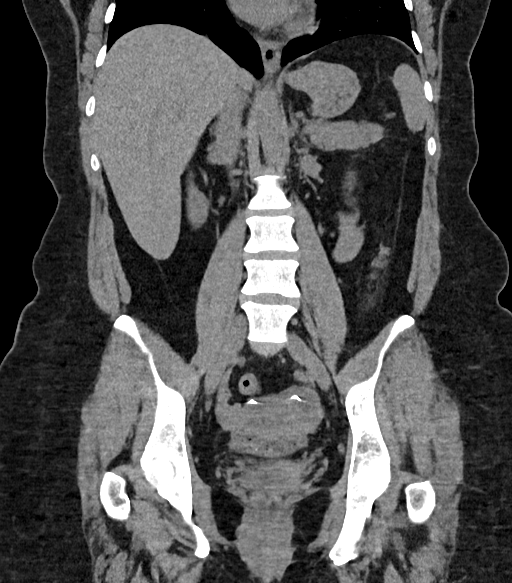

[11 of 46 positions shown; findings below may reference images not displayed]

DIAGNOSTIC STUDIES

EXAM

CT kidney stone

INDICATION

Rt flank pain. hematuria
C/O ABD PAIN WHICH BEGAN LAST NIGHT. CONSTIPATION. DENIES N/V/D. H/O PRIOR C-SECTION.   HB

TECHNIQUE

All CT scans at this facility use dose modulation, iterative reconstruction, and/or weight based
dosing when appropriate to reduce radiation dose to as low as reasonably achievable.

Number of previous computed tomography exams in the last 12 months is 3.

Number of previous nuclear medicine myocardial perfusion studies in the last 12 months is 0  .

COMPARISONS

None available

FINDINGS

Mild scarring is noted in the lung bases.

Liver is unremarkable. Prior cholecystectomy. Spleen is normal.

Adrenal glands are normal. Hyperdense left renal cyst measures 9 millimeters image 59. Medial right
renal cyst measures 1.6 cm. Small cyst off the lower pole right kidney measures 9 millimeters.

No renal calculi or hydronephrosis is evident.

Pancreas is unremarkable. No retroperitoneal adenopathy. Unopacified vasculature is normal.

Small and large bowel are normal in caliber. No mesenteric adenopathy. Cecum is located within the
inferior pelvis. Appendix is not seen but there is no CT evidence for appendicitis.

Prior tubal ligation is evident. Uterus is normal in size. Bilateral posterior adnexal cysts are
seen measuring 3.8 cm. No pelvic adenopathy. No bladder calculi are seen.

IMPRESSION

No hydronephrosis or renal calculi. There are simple and complex renal cysts bilaterally.

Bilateral ovarian cysts which likely are physiologic in a premenopausal female. Follow-up pelvic
ultrasound in 3-4 months would be reasonable to exclude progression.

Tech Notes:

C/O ABD PAIN WHICH BEGAN LAST NIGHT. CONSTIPATION. DENIES N/V/D. H/O PRIOR C-SECTION.
HB

## 2021-12-15 IMAGING — CR [ID]
3 series · 3 of 3 positions shown · non-contrast
Comparison: none

[x foot ap right]
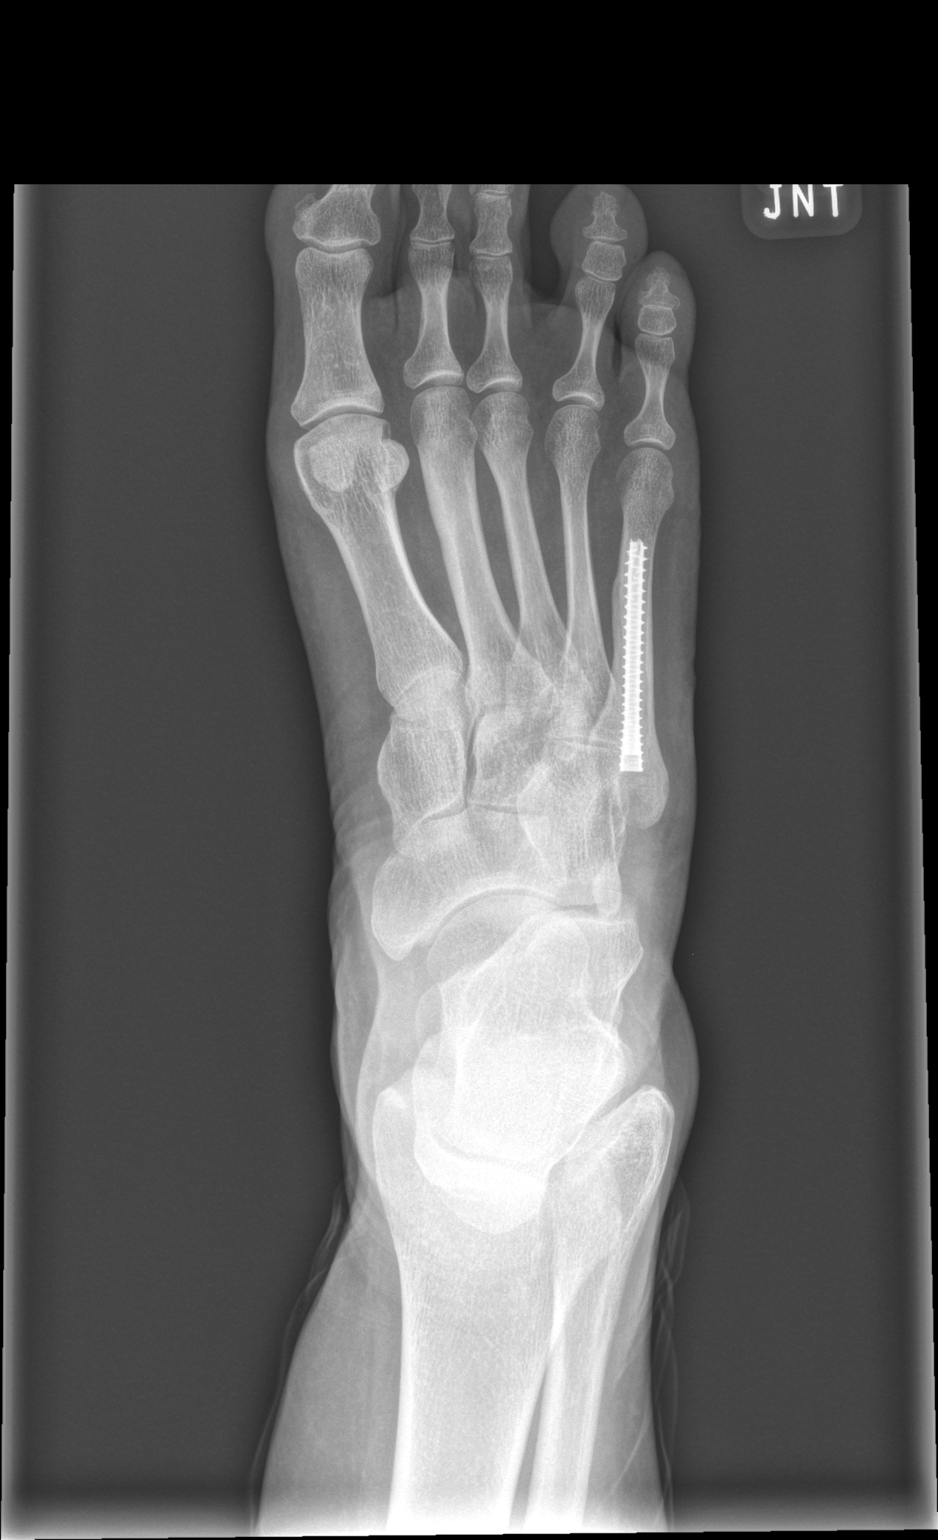

[x foot obl right]
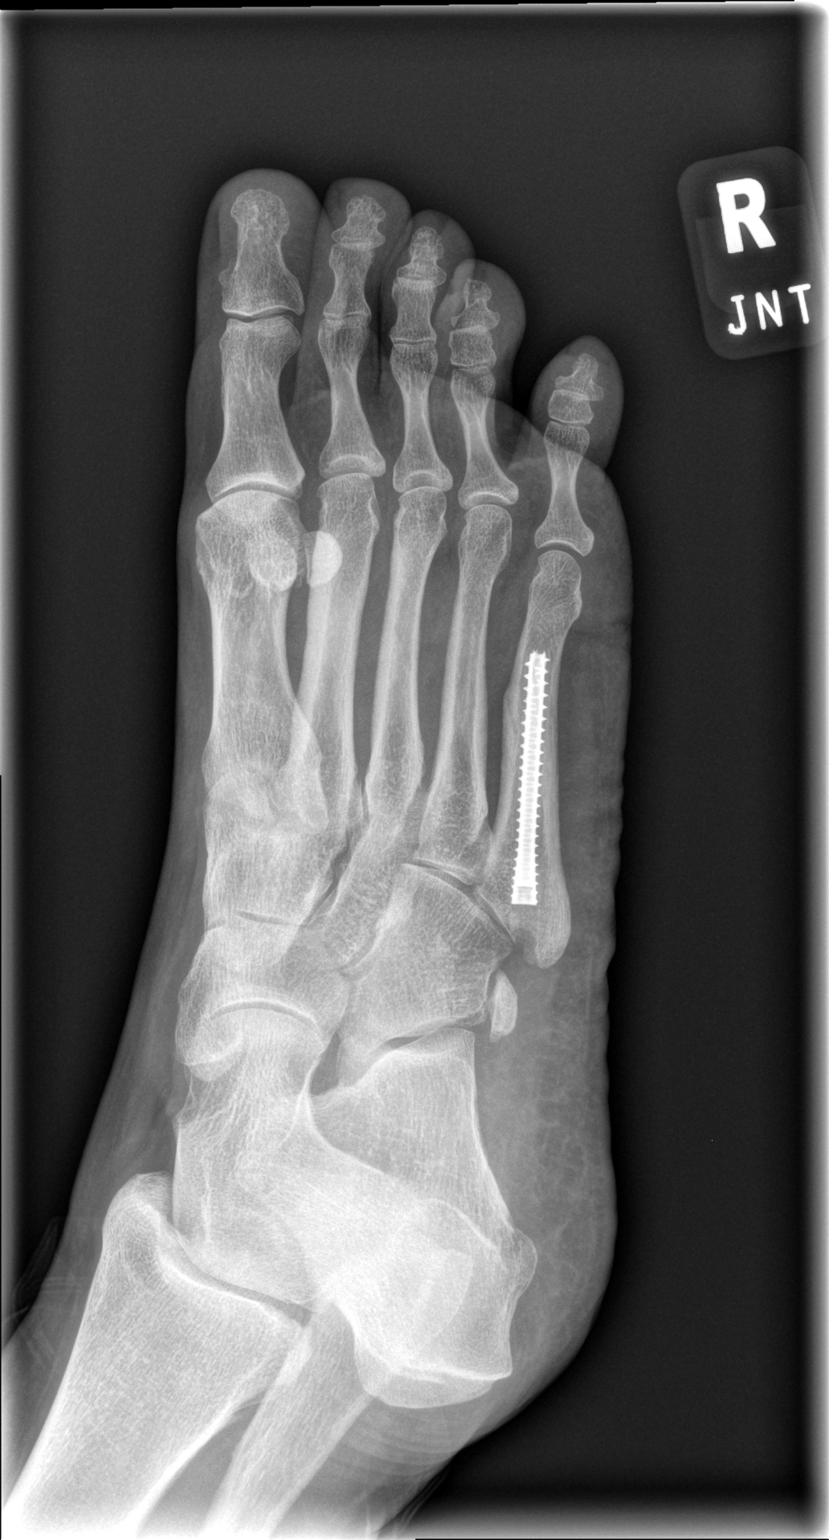

[x foot lat right]
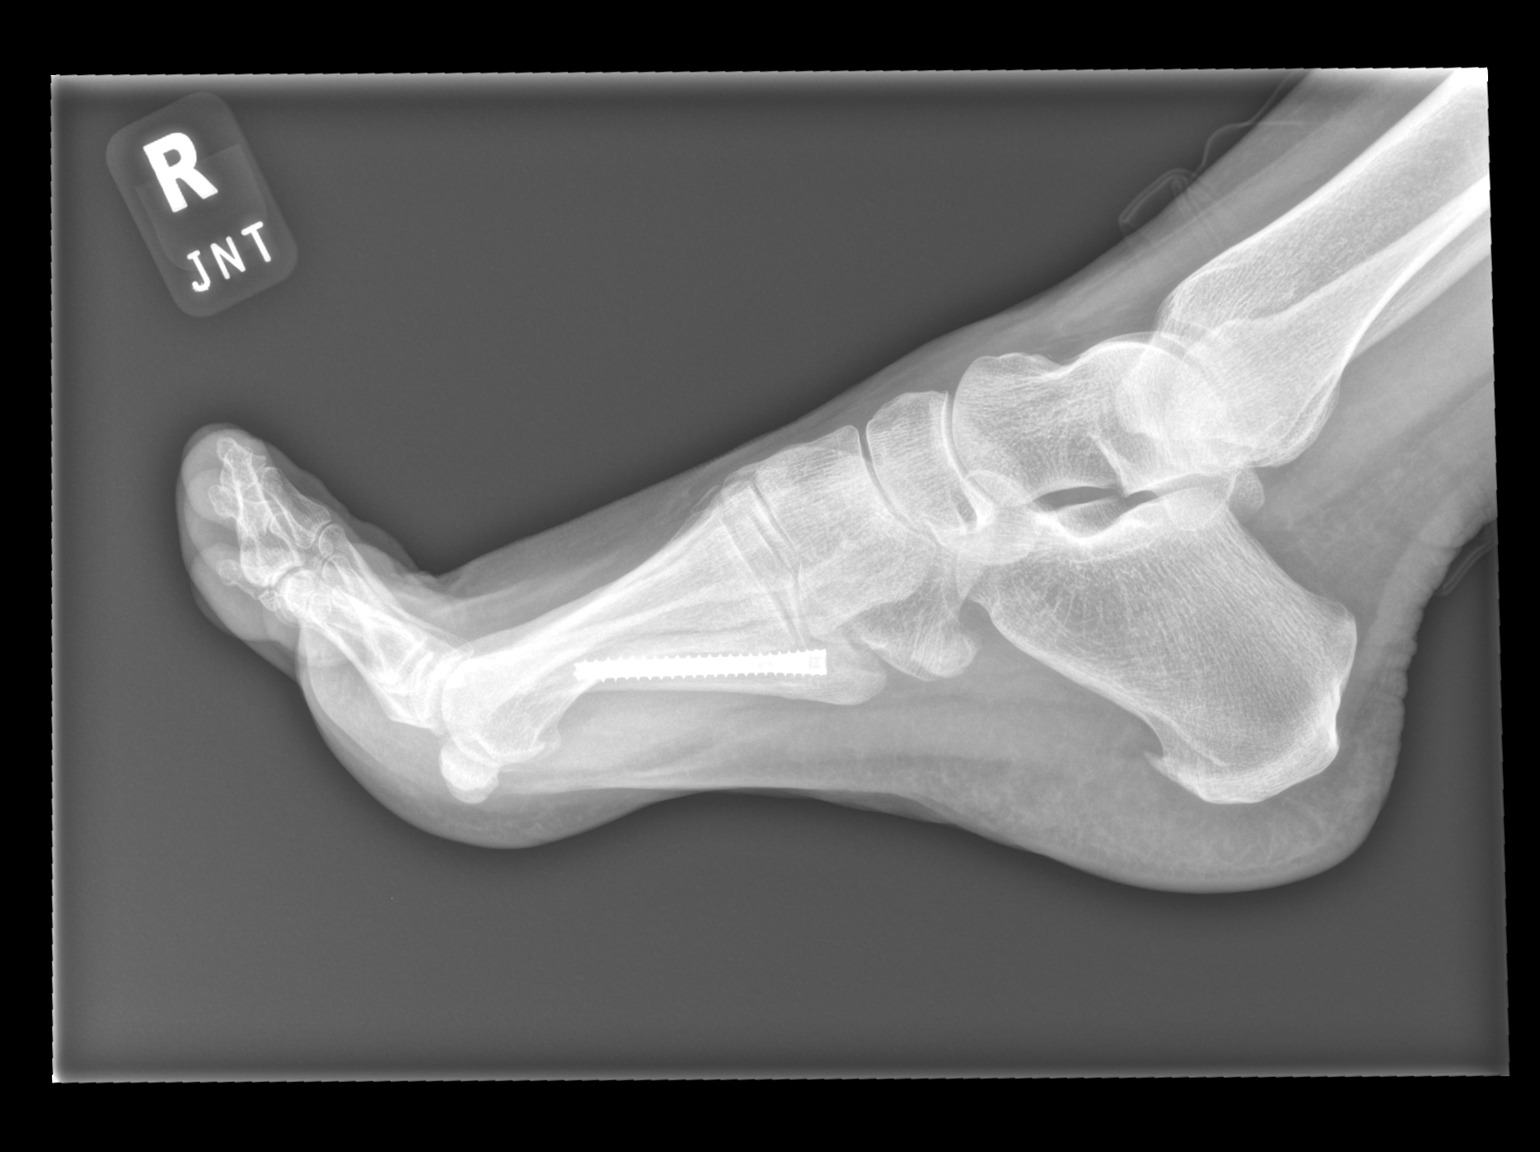

[3 of 3 positions shown; findings below may reference images not displayed]

DIAGNOSTIC STUDIES

EXAM

XR foot RT min 3V

INDICATION

rt foot orif
F/u right foot ORIF x 10/24/21, pt c/o pain in right foot. JT

TECHNIQUE

AP lateral and oblique views

COMPARISONS

November 04, 2021.

FINDINGS

Patient is status post ORIF of 5th metatarsal fracture which demonstrates anatomic alignment.
Appearance is stable dating back to prior exam. No new fractures are seen.

IMPRESSION

Unchanged postoperative changes of the right foot.

Tech Notes:

F/u right foot ORIF x 10/24/21, pt c/o pain in right foot. JT
